# Patient Record
Sex: Male | Born: 1955 | Race: Black or African American | Smoking: Never smoker
Health system: Southern US, Community
[De-identification: ages and names within clinical notes are randomized; demographics above are authoritative.]

## PROBLEM LIST (undated history)

## (undated) DIAGNOSIS — H269 Unspecified cataract: Secondary | ICD-10-CM

## (undated) DIAGNOSIS — M1990 Unspecified osteoarthritis, unspecified site: Secondary | ICD-10-CM

## (undated) DIAGNOSIS — A159 Respiratory tuberculosis unspecified: Secondary | ICD-10-CM

## (undated) DIAGNOSIS — T7840XA Allergy, unspecified, initial encounter: Secondary | ICD-10-CM

## (undated) DIAGNOSIS — K219 Gastro-esophageal reflux disease without esophagitis: Secondary | ICD-10-CM

## (undated) DIAGNOSIS — I10 Essential (primary) hypertension: Secondary | ICD-10-CM

## (undated) HISTORY — PX: ACHILLES TENDON SURGERY: SHX542

## (undated) HISTORY — DX: Essential (primary) hypertension: I10

## (undated) HISTORY — DX: Respiratory tuberculosis unspecified: A15.9

## (undated) HISTORY — PX: SHOULDER ARTHROSCOPY: SHX128

## (undated) HISTORY — DX: Unspecified osteoarthritis, unspecified site: M19.90

## (undated) HISTORY — DX: Allergy, unspecified, initial encounter: T78.40XA

## (undated) HISTORY — DX: Unspecified cataract: H26.9

## (undated) HISTORY — DX: Gastro-esophageal reflux disease without esophagitis: K21.9

## (undated) HISTORY — PX: KNEE ARTHROSCOPY: SUR90

---

## 2014-02-13 DIAGNOSIS — K219 Gastro-esophageal reflux disease without esophagitis: Secondary | ICD-10-CM | POA: Diagnosis not present

## 2014-02-13 DIAGNOSIS — M2550 Pain in unspecified joint: Secondary | ICD-10-CM | POA: Diagnosis not present

## 2014-02-13 DIAGNOSIS — M1990 Unspecified osteoarthritis, unspecified site: Secondary | ICD-10-CM | POA: Diagnosis not present

## 2014-02-13 DIAGNOSIS — R5383 Other fatigue: Secondary | ICD-10-CM | POA: Diagnosis not present

## 2015-01-06 ENCOUNTER — Ambulatory Visit
Admission: RE | Admit: 2015-01-06 | Discharge: 2015-01-06 | Disposition: A | Discharge status: Home / Self Care | Payer: PPO | Source: Ambulatory Visit | Attending: Family Medicine | Admitting: Family Medicine

## 2015-01-06 ENCOUNTER — Other Ambulatory Visit: Payer: Self-pay | Admitting: Family Medicine

## 2015-01-06 DIAGNOSIS — M1711 Unilateral primary osteoarthritis, right knee: Secondary | ICD-10-CM

## 2015-09-22 DIAGNOSIS — J309 Allergic rhinitis, unspecified: Secondary | ICD-10-CM | POA: Diagnosis not present

## 2015-09-22 DIAGNOSIS — E782 Mixed hyperlipidemia: Secondary | ICD-10-CM | POA: Diagnosis not present

## 2015-09-22 DIAGNOSIS — K219 Gastro-esophageal reflux disease without esophagitis: Secondary | ICD-10-CM | POA: Diagnosis not present

## 2015-09-22 DIAGNOSIS — Z0000 Encounter for general adult medical examination without abnormal findings: Secondary | ICD-10-CM | POA: Diagnosis not present

## 2015-09-22 DIAGNOSIS — M19011 Primary osteoarthritis, right shoulder: Secondary | ICD-10-CM | POA: Diagnosis not present

## 2015-09-22 DIAGNOSIS — Z23 Encounter for immunization: Secondary | ICD-10-CM | POA: Diagnosis not present

## 2015-10-26 DIAGNOSIS — H2513 Age-related nuclear cataract, bilateral: Secondary | ICD-10-CM | POA: Diagnosis not present

## 2015-10-26 DIAGNOSIS — H04123 Dry eye syndrome of bilateral lacrimal glands: Secondary | ICD-10-CM | POA: Diagnosis not present

## 2015-10-26 DIAGNOSIS — H35033 Hypertensive retinopathy, bilateral: Secondary | ICD-10-CM | POA: Diagnosis not present

## 2015-10-26 DIAGNOSIS — H25013 Cortical age-related cataract, bilateral: Secondary | ICD-10-CM | POA: Diagnosis not present

## 2015-12-07 DIAGNOSIS — Z136 Encounter for screening for cardiovascular disorders: Secondary | ICD-10-CM | POA: Diagnosis not present

## 2015-12-07 DIAGNOSIS — R5383 Other fatigue: Secondary | ICD-10-CM | POA: Diagnosis not present

## 2015-12-07 DIAGNOSIS — E782 Mixed hyperlipidemia: Secondary | ICD-10-CM | POA: Diagnosis not present

## 2015-12-09 DIAGNOSIS — M2550 Pain in unspecified joint: Secondary | ICD-10-CM | POA: Diagnosis not present

## 2015-12-09 DIAGNOSIS — R739 Hyperglycemia, unspecified: Secondary | ICD-10-CM | POA: Diagnosis not present

## 2015-12-09 DIAGNOSIS — E782 Mixed hyperlipidemia: Secondary | ICD-10-CM | POA: Diagnosis not present

## 2015-12-09 DIAGNOSIS — J309 Allergic rhinitis, unspecified: Secondary | ICD-10-CM | POA: Diagnosis not present

## 2015-12-09 DIAGNOSIS — K219 Gastro-esophageal reflux disease without esophagitis: Secondary | ICD-10-CM | POA: Diagnosis not present

## 2016-03-21 DIAGNOSIS — E782 Mixed hyperlipidemia: Secondary | ICD-10-CM | POA: Diagnosis not present

## 2016-03-21 DIAGNOSIS — E669 Obesity, unspecified: Secondary | ICD-10-CM | POA: Diagnosis not present

## 2016-03-21 DIAGNOSIS — Z6831 Body mass index (BMI) 31.0-31.9, adult: Secondary | ICD-10-CM | POA: Diagnosis not present

## 2016-03-21 DIAGNOSIS — R5383 Other fatigue: Secondary | ICD-10-CM | POA: Diagnosis not present

## 2016-08-15 DIAGNOSIS — R5383 Other fatigue: Secondary | ICD-10-CM | POA: Diagnosis not present

## 2016-08-15 DIAGNOSIS — E559 Vitamin D deficiency, unspecified: Secondary | ICD-10-CM | POA: Diagnosis not present

## 2016-08-15 DIAGNOSIS — Z136 Encounter for screening for cardiovascular disorders: Secondary | ICD-10-CM | POA: Diagnosis not present

## 2016-08-17 DIAGNOSIS — E559 Vitamin D deficiency, unspecified: Secondary | ICD-10-CM | POA: Diagnosis not present

## 2016-08-17 DIAGNOSIS — E782 Mixed hyperlipidemia: Secondary | ICD-10-CM | POA: Diagnosis not present

## 2016-08-17 DIAGNOSIS — K219 Gastro-esophageal reflux disease without esophagitis: Secondary | ICD-10-CM | POA: Diagnosis not present

## 2016-08-17 DIAGNOSIS — Z6832 Body mass index (BMI) 32.0-32.9, adult: Secondary | ICD-10-CM | POA: Diagnosis not present

## 2016-09-05 DIAGNOSIS — G8192 Hemiplegia, unspecified affecting left dominant side: Secondary | ICD-10-CM | POA: Diagnosis not present

## 2016-09-05 DIAGNOSIS — Z6831 Body mass index (BMI) 31.0-31.9, adult: Secondary | ICD-10-CM | POA: Diagnosis not present

## 2016-09-05 DIAGNOSIS — I10 Essential (primary) hypertension: Secondary | ICD-10-CM | POA: Diagnosis not present

## 2016-09-05 DIAGNOSIS — R030 Elevated blood-pressure reading, without diagnosis of hypertension: Secondary | ICD-10-CM | POA: Diagnosis not present

## 2016-09-06 ENCOUNTER — Other Ambulatory Visit: Payer: Self-pay | Admitting: Family Medicine

## 2016-09-06 DIAGNOSIS — R200 Anesthesia of skin: Secondary | ICD-10-CM

## 2016-09-07 ENCOUNTER — Ambulatory Visit
Admission: RE | Admit: 2016-09-07 | Discharge: 2016-09-07 | Disposition: A | Discharge status: Home / Self Care | Payer: PPO | Source: Ambulatory Visit | Attending: Family Medicine | Admitting: Family Medicine

## 2016-09-07 DIAGNOSIS — R42 Dizziness and giddiness: Secondary | ICD-10-CM | POA: Diagnosis not present

## 2016-09-07 DIAGNOSIS — R200 Anesthesia of skin: Secondary | ICD-10-CM | POA: Diagnosis not present

## 2016-09-19 DIAGNOSIS — S43431S Superior glenoid labrum lesion of right shoulder, sequela: Secondary | ICD-10-CM | POA: Diagnosis not present

## 2016-09-19 DIAGNOSIS — K219 Gastro-esophageal reflux disease without esophagitis: Secondary | ICD-10-CM | POA: Diagnosis not present

## 2016-09-19 DIAGNOSIS — E782 Mixed hyperlipidemia: Secondary | ICD-10-CM | POA: Diagnosis not present

## 2016-09-19 DIAGNOSIS — Z0000 Encounter for general adult medical examination without abnormal findings: Secondary | ICD-10-CM | POA: Diagnosis not present

## 2016-09-19 DIAGNOSIS — Z1211 Encounter for screening for malignant neoplasm of colon: Secondary | ICD-10-CM | POA: Diagnosis not present

## 2016-09-19 DIAGNOSIS — I10 Essential (primary) hypertension: Secondary | ICD-10-CM | POA: Diagnosis not present

## 2016-09-19 DIAGNOSIS — M1990 Unspecified osteoarthritis, unspecified site: Secondary | ICD-10-CM | POA: Diagnosis not present

## 2016-09-23 ENCOUNTER — Encounter: Payer: Self-pay | Admitting: Internal Medicine

## 2016-11-15 ENCOUNTER — Ambulatory Visit (AMBULATORY_SURGERY_CENTER): Payer: Self-pay | Admitting: *Deleted

## 2016-11-15 VITALS — Ht 69.0 in | Wt 207.2 lb

## 2016-11-15 DIAGNOSIS — Z1211 Encounter for screening for malignant neoplasm of colon: Secondary | ICD-10-CM

## 2016-11-15 MED ORDER — NA SULFATE-K SULFATE-MG SULF 17.5-3.13-1.6 GM/177ML PO SOLN: ORAL | 0 refills | Status: DC

## 2016-11-15 NOTE — Progress Notes (Signed)
No allergies to eggs or soy. No problems with anesthesia.  Pt given Emmi instructions for colonoscopy  No oxygen use  No diet drug use  

## 2016-11-17 ENCOUNTER — Encounter: Payer: Self-pay | Admitting: Internal Medicine

## 2016-11-29 ENCOUNTER — Encounter: Payer: PPO | Admitting: Internal Medicine

## 2017-03-15 DIAGNOSIS — E559 Vitamin D deficiency, unspecified: Secondary | ICD-10-CM | POA: Diagnosis not present

## 2017-03-15 DIAGNOSIS — I10 Essential (primary) hypertension: Secondary | ICD-10-CM | POA: Diagnosis not present

## 2017-03-15 DIAGNOSIS — Z6830 Body mass index (BMI) 30.0-30.9, adult: Secondary | ICD-10-CM | POA: Diagnosis not present

## 2017-03-15 DIAGNOSIS — E782 Mixed hyperlipidemia: Secondary | ICD-10-CM | POA: Diagnosis not present

## 2017-04-26 ENCOUNTER — Encounter: Payer: Self-pay | Admitting: Family Medicine

## 2017-05-30 DIAGNOSIS — Z6831 Body mass index (BMI) 31.0-31.9, adult: Secondary | ICD-10-CM | POA: Diagnosis not present

## 2017-05-30 DIAGNOSIS — J101 Influenza due to other identified influenza virus with other respiratory manifestations: Secondary | ICD-10-CM | POA: Diagnosis not present

## 2017-05-30 DIAGNOSIS — J069 Acute upper respiratory infection, unspecified: Secondary | ICD-10-CM | POA: Diagnosis not present

## 2017-06-19 DIAGNOSIS — I10 Essential (primary) hypertension: Secondary | ICD-10-CM | POA: Diagnosis not present

## 2017-06-21 DIAGNOSIS — J309 Allergic rhinitis, unspecified: Secondary | ICD-10-CM | POA: Diagnosis not present

## 2017-06-21 DIAGNOSIS — R05 Cough: Secondary | ICD-10-CM | POA: Diagnosis not present

## 2017-06-21 DIAGNOSIS — I10 Essential (primary) hypertension: Secondary | ICD-10-CM | POA: Diagnosis not present

## 2017-06-21 DIAGNOSIS — Z6831 Body mass index (BMI) 31.0-31.9, adult: Secondary | ICD-10-CM | POA: Diagnosis not present

## 2017-07-19 DIAGNOSIS — K219 Gastro-esophageal reflux disease without esophagitis: Secondary | ICD-10-CM | POA: Diagnosis not present

## 2017-07-19 DIAGNOSIS — D179 Benign lipomatous neoplasm, unspecified: Secondary | ICD-10-CM | POA: Diagnosis not present

## 2017-07-19 DIAGNOSIS — R05 Cough: Secondary | ICD-10-CM | POA: Diagnosis not present

## 2017-07-19 DIAGNOSIS — J309 Allergic rhinitis, unspecified: Secondary | ICD-10-CM | POA: Diagnosis not present

## 2017-07-19 DIAGNOSIS — Z6831 Body mass index (BMI) 31.0-31.9, adult: Secondary | ICD-10-CM | POA: Diagnosis not present

## 2017-09-21 DIAGNOSIS — Z125 Encounter for screening for malignant neoplasm of prostate: Secondary | ICD-10-CM | POA: Diagnosis not present

## 2017-09-21 DIAGNOSIS — Z1211 Encounter for screening for malignant neoplasm of colon: Secondary | ICD-10-CM | POA: Diagnosis not present

## 2017-09-21 DIAGNOSIS — Z0000 Encounter for general adult medical examination without abnormal findings: Secondary | ICD-10-CM | POA: Diagnosis not present

## 2017-09-21 DIAGNOSIS — Z6831 Body mass index (BMI) 31.0-31.9, adult: Secondary | ICD-10-CM | POA: Diagnosis not present

## 2017-10-11 ENCOUNTER — Ambulatory Visit: Payer: PPO | Admitting: Psychology

## 2018-03-22 DIAGNOSIS — Z6831 Body mass index (BMI) 31.0-31.9, adult: Secondary | ICD-10-CM | POA: Diagnosis not present

## 2018-03-22 DIAGNOSIS — K219 Gastro-esophageal reflux disease without esophagitis: Secondary | ICD-10-CM | POA: Diagnosis not present

## 2018-03-22 DIAGNOSIS — F320 Major depressive disorder, single episode, mild: Secondary | ICD-10-CM | POA: Diagnosis not present

## 2018-04-04 DIAGNOSIS — H2513 Age-related nuclear cataract, bilateral: Secondary | ICD-10-CM | POA: Diagnosis not present

## 2018-04-04 DIAGNOSIS — H25013 Cortical age-related cataract, bilateral: Secondary | ICD-10-CM | POA: Diagnosis not present

## 2018-04-04 DIAGNOSIS — H35033 Hypertensive retinopathy, bilateral: Secondary | ICD-10-CM | POA: Diagnosis not present

## 2018-04-04 DIAGNOSIS — H02823 Cysts of right eye, unspecified eyelid: Secondary | ICD-10-CM | POA: Diagnosis not present

## 2018-09-24 DIAGNOSIS — M19011 Primary osteoarthritis, right shoulder: Secondary | ICD-10-CM | POA: Diagnosis not present

## 2018-09-24 DIAGNOSIS — J309 Allergic rhinitis, unspecified: Secondary | ICD-10-CM | POA: Diagnosis not present

## 2018-09-24 DIAGNOSIS — Z0000 Encounter for general adult medical examination without abnormal findings: Secondary | ICD-10-CM | POA: Diagnosis not present

## 2018-09-24 DIAGNOSIS — M1711 Unilateral primary osteoarthritis, right knee: Secondary | ICD-10-CM | POA: Diagnosis not present

## 2018-09-24 DIAGNOSIS — R51 Headache: Secondary | ICD-10-CM | POA: Diagnosis not present

## 2018-09-27 ENCOUNTER — Other Ambulatory Visit: Payer: Self-pay

## 2018-10-23 DIAGNOSIS — Z23 Encounter for immunization: Secondary | ICD-10-CM | POA: Diagnosis not present

## 2018-10-24 DIAGNOSIS — R030 Elevated blood-pressure reading, without diagnosis of hypertension: Secondary | ICD-10-CM | POA: Diagnosis not present

## 2018-10-24 DIAGNOSIS — R002 Palpitations: Secondary | ICD-10-CM | POA: Diagnosis not present

## 2018-10-24 DIAGNOSIS — E782 Mixed hyperlipidemia: Secondary | ICD-10-CM | POA: Diagnosis not present

## 2018-10-24 DIAGNOSIS — G4700 Insomnia, unspecified: Secondary | ICD-10-CM | POA: Diagnosis not present

## 2018-10-24 DIAGNOSIS — E559 Vitamin D deficiency, unspecified: Secondary | ICD-10-CM | POA: Diagnosis not present

## 2018-10-24 DIAGNOSIS — Z733 Stress, not elsewhere classified: Secondary | ICD-10-CM | POA: Diagnosis not present

## 2018-10-24 DIAGNOSIS — E079 Disorder of thyroid, unspecified: Secondary | ICD-10-CM | POA: Diagnosis not present

## 2018-10-24 DIAGNOSIS — I10 Essential (primary) hypertension: Secondary | ICD-10-CM | POA: Diagnosis not present

## 2018-10-30 DIAGNOSIS — E559 Vitamin D deficiency, unspecified: Secondary | ICD-10-CM | POA: Diagnosis not present

## 2018-10-30 DIAGNOSIS — G4700 Insomnia, unspecified: Secondary | ICD-10-CM | POA: Diagnosis not present

## 2018-10-30 DIAGNOSIS — R002 Palpitations: Secondary | ICD-10-CM | POA: Diagnosis not present

## 2018-10-30 DIAGNOSIS — Z733 Stress, not elsewhere classified: Secondary | ICD-10-CM | POA: Diagnosis not present

## 2018-11-27 DIAGNOSIS — Z23 Encounter for immunization: Secondary | ICD-10-CM | POA: Diagnosis not present

## 2018-12-18 DIAGNOSIS — G4700 Insomnia, unspecified: Secondary | ICD-10-CM | POA: Diagnosis not present

## 2018-12-18 DIAGNOSIS — E782 Mixed hyperlipidemia: Secondary | ICD-10-CM | POA: Diagnosis not present

## 2018-12-18 DIAGNOSIS — Z6829 Body mass index (BMI) 29.0-29.9, adult: Secondary | ICD-10-CM | POA: Diagnosis not present

## 2018-12-18 DIAGNOSIS — E559 Vitamin D deficiency, unspecified: Secondary | ICD-10-CM | POA: Diagnosis not present

## 2019-01-21 ENCOUNTER — Other Ambulatory Visit: Payer: Self-pay

## 2019-04-23 DIAGNOSIS — Z23 Encounter for immunization: Secondary | ICD-10-CM | POA: Diagnosis not present

## 2019-05-15 DIAGNOSIS — E559 Vitamin D deficiency, unspecified: Secondary | ICD-10-CM | POA: Diagnosis not present

## 2019-05-15 DIAGNOSIS — I10 Essential (primary) hypertension: Secondary | ICD-10-CM | POA: Diagnosis not present

## 2019-05-15 DIAGNOSIS — E782 Mixed hyperlipidemia: Secondary | ICD-10-CM | POA: Diagnosis not present

## 2019-05-21 DIAGNOSIS — E782 Mixed hyperlipidemia: Secondary | ICD-10-CM | POA: Diagnosis not present

## 2019-05-21 DIAGNOSIS — K219 Gastro-esophageal reflux disease without esophagitis: Secondary | ICD-10-CM | POA: Diagnosis not present

## 2019-05-21 DIAGNOSIS — I10 Essential (primary) hypertension: Secondary | ICD-10-CM | POA: Diagnosis not present

## 2019-05-21 DIAGNOSIS — E559 Vitamin D deficiency, unspecified: Secondary | ICD-10-CM | POA: Diagnosis not present

## 2019-09-25 DIAGNOSIS — Z0000 Encounter for general adult medical examination without abnormal findings: Secondary | ICD-10-CM | POA: Diagnosis not present

## 2019-09-25 DIAGNOSIS — Z1339 Encounter for screening examination for other mental health and behavioral disorders: Secondary | ICD-10-CM | POA: Diagnosis not present

## 2019-09-25 DIAGNOSIS — Z1331 Encounter for screening for depression: Secondary | ICD-10-CM | POA: Diagnosis not present

## 2019-09-25 DIAGNOSIS — S4380XA Sprain of other specified parts of unspecified shoulder girdle, initial encounter: Secondary | ICD-10-CM | POA: Diagnosis not present

## 2019-09-25 DIAGNOSIS — M170 Bilateral primary osteoarthritis of knee: Secondary | ICD-10-CM | POA: Diagnosis not present

## 2019-09-25 DIAGNOSIS — M19019 Primary osteoarthritis, unspecified shoulder: Secondary | ICD-10-CM | POA: Diagnosis not present

## 2019-11-12 DIAGNOSIS — E559 Vitamin D deficiency, unspecified: Secondary | ICD-10-CM | POA: Diagnosis not present

## 2019-11-12 DIAGNOSIS — E782 Mixed hyperlipidemia: Secondary | ICD-10-CM | POA: Diagnosis not present

## 2019-11-21 DIAGNOSIS — E782 Mixed hyperlipidemia: Secondary | ICD-10-CM | POA: Diagnosis not present

## 2019-11-21 DIAGNOSIS — G4700 Insomnia, unspecified: Secondary | ICD-10-CM | POA: Diagnosis not present

## 2019-11-21 DIAGNOSIS — M1990 Unspecified osteoarthritis, unspecified site: Secondary | ICD-10-CM | POA: Diagnosis not present

## 2019-11-21 DIAGNOSIS — J309 Allergic rhinitis, unspecified: Secondary | ICD-10-CM | POA: Diagnosis not present

## 2019-11-21 DIAGNOSIS — E559 Vitamin D deficiency, unspecified: Secondary | ICD-10-CM | POA: Diagnosis not present

## 2020-05-07 DIAGNOSIS — L723 Sebaceous cyst: Secondary | ICD-10-CM | POA: Diagnosis not present

## 2020-05-07 DIAGNOSIS — I10 Essential (primary) hypertension: Secondary | ICD-10-CM | POA: Diagnosis not present

## 2020-05-07 DIAGNOSIS — L0291 Cutaneous abscess, unspecified: Secondary | ICD-10-CM | POA: Diagnosis not present

## 2020-05-07 DIAGNOSIS — L089 Local infection of the skin and subcutaneous tissue, unspecified: Secondary | ICD-10-CM | POA: Diagnosis not present

## 2020-05-12 DIAGNOSIS — R030 Elevated blood-pressure reading, without diagnosis of hypertension: Secondary | ICD-10-CM | POA: Diagnosis not present

## 2020-05-29 DIAGNOSIS — I10 Essential (primary) hypertension: Secondary | ICD-10-CM | POA: Diagnosis not present

## 2020-05-29 DIAGNOSIS — E782 Mixed hyperlipidemia: Secondary | ICD-10-CM | POA: Diagnosis not present

## 2020-06-01 DIAGNOSIS — H25013 Cortical age-related cataract, bilateral: Secondary | ICD-10-CM | POA: Diagnosis not present

## 2020-06-01 DIAGNOSIS — H35363 Drusen (degenerative) of macula, bilateral: Secondary | ICD-10-CM | POA: Diagnosis not present

## 2020-06-01 DIAGNOSIS — H2513 Age-related nuclear cataract, bilateral: Secondary | ICD-10-CM | POA: Diagnosis not present

## 2020-06-01 DIAGNOSIS — H524 Presbyopia: Secondary | ICD-10-CM | POA: Diagnosis not present

## 2020-06-01 DIAGNOSIS — H43812 Vitreous degeneration, left eye: Secondary | ICD-10-CM | POA: Diagnosis not present

## 2020-06-02 DIAGNOSIS — E119 Type 2 diabetes mellitus without complications: Secondary | ICD-10-CM | POA: Diagnosis not present

## 2020-06-02 DIAGNOSIS — I10 Essential (primary) hypertension: Secondary | ICD-10-CM | POA: Diagnosis not present

## 2020-06-02 DIAGNOSIS — E782 Mixed hyperlipidemia: Secondary | ICD-10-CM | POA: Diagnosis not present

## 2020-06-11 DIAGNOSIS — I10 Essential (primary) hypertension: Secondary | ICD-10-CM | POA: Diagnosis not present

## 2020-06-11 DIAGNOSIS — E782 Mixed hyperlipidemia: Secondary | ICD-10-CM | POA: Diagnosis not present

## 2020-06-11 DIAGNOSIS — E669 Obesity, unspecified: Secondary | ICD-10-CM | POA: Diagnosis not present

## 2020-06-11 DIAGNOSIS — E119 Type 2 diabetes mellitus without complications: Secondary | ICD-10-CM | POA: Diagnosis not present

## 2020-06-29 DIAGNOSIS — E669 Obesity, unspecified: Secondary | ICD-10-CM | POA: Diagnosis not present

## 2020-06-29 DIAGNOSIS — I10 Essential (primary) hypertension: Secondary | ICD-10-CM | POA: Diagnosis not present

## 2020-06-29 DIAGNOSIS — E782 Mixed hyperlipidemia: Secondary | ICD-10-CM | POA: Diagnosis not present

## 2020-07-13 DIAGNOSIS — I10 Essential (primary) hypertension: Secondary | ICD-10-CM | POA: Diagnosis not present

## 2020-07-13 DIAGNOSIS — E119 Type 2 diabetes mellitus without complications: Secondary | ICD-10-CM | POA: Diagnosis not present

## 2020-07-13 DIAGNOSIS — E669 Obesity, unspecified: Secondary | ICD-10-CM | POA: Diagnosis not present

## 2020-07-13 DIAGNOSIS — E782 Mixed hyperlipidemia: Secondary | ICD-10-CM | POA: Diagnosis not present

## 2020-07-16 DIAGNOSIS — Z23 Encounter for immunization: Secondary | ICD-10-CM | POA: Diagnosis not present

## 2020-07-20 DIAGNOSIS — E782 Mixed hyperlipidemia: Secondary | ICD-10-CM | POA: Diagnosis not present

## 2020-07-20 DIAGNOSIS — I10 Essential (primary) hypertension: Secondary | ICD-10-CM | POA: Diagnosis not present

## 2020-07-20 DIAGNOSIS — R202 Paresthesia of skin: Secondary | ICD-10-CM | POA: Diagnosis not present

## 2020-07-20 DIAGNOSIS — E663 Overweight: Secondary | ICD-10-CM | POA: Diagnosis not present

## 2020-08-19 DIAGNOSIS — E119 Type 2 diabetes mellitus without complications: Secondary | ICD-10-CM | POA: Diagnosis not present

## 2020-08-19 DIAGNOSIS — I10 Essential (primary) hypertension: Secondary | ICD-10-CM | POA: Diagnosis not present

## 2020-08-19 DIAGNOSIS — F4321 Adjustment disorder with depressed mood: Secondary | ICD-10-CM | POA: Diagnosis not present

## 2020-10-01 DIAGNOSIS — Z1331 Encounter for screening for depression: Secondary | ICD-10-CM | POA: Diagnosis not present

## 2020-10-01 DIAGNOSIS — Z0000 Encounter for general adult medical examination without abnormal findings: Secondary | ICD-10-CM | POA: Diagnosis not present

## 2020-10-01 DIAGNOSIS — Z1339 Encounter for screening examination for other mental health and behavioral disorders: Secondary | ICD-10-CM | POA: Diagnosis not present

## 2020-10-01 DIAGNOSIS — G4700 Insomnia, unspecified: Secondary | ICD-10-CM | POA: Diagnosis not present

## 2020-11-19 DIAGNOSIS — I10 Essential (primary) hypertension: Secondary | ICD-10-CM | POA: Diagnosis not present

## 2020-11-19 DIAGNOSIS — E559 Vitamin D deficiency, unspecified: Secondary | ICD-10-CM | POA: Diagnosis not present

## 2020-11-19 DIAGNOSIS — Z125 Encounter for screening for malignant neoplasm of prostate: Secondary | ICD-10-CM | POA: Diagnosis not present

## 2020-11-19 DIAGNOSIS — Z23 Encounter for immunization: Secondary | ICD-10-CM | POA: Diagnosis not present

## 2020-11-19 DIAGNOSIS — E782 Mixed hyperlipidemia: Secondary | ICD-10-CM | POA: Diagnosis not present

## 2020-11-19 DIAGNOSIS — E669 Obesity, unspecified: Secondary | ICD-10-CM | POA: Diagnosis not present

## 2020-11-23 DIAGNOSIS — I10 Essential (primary) hypertension: Secondary | ICD-10-CM | POA: Diagnosis not present

## 2020-11-23 DIAGNOSIS — R5383 Other fatigue: Secondary | ICD-10-CM | POA: Diagnosis not present

## 2020-11-23 DIAGNOSIS — E782 Mixed hyperlipidemia: Secondary | ICD-10-CM | POA: Diagnosis not present

## 2020-11-23 DIAGNOSIS — F4321 Adjustment disorder with depressed mood: Secondary | ICD-10-CM | POA: Diagnosis not present

## 2020-11-23 DIAGNOSIS — E559 Vitamin D deficiency, unspecified: Secondary | ICD-10-CM | POA: Diagnosis not present

## 2020-11-23 DIAGNOSIS — R7989 Other specified abnormal findings of blood chemistry: Secondary | ICD-10-CM | POA: Diagnosis not present

## 2020-11-27 ENCOUNTER — Other Ambulatory Visit: Payer: Self-pay

## 2020-11-27 ENCOUNTER — Emergency Department (HOSPITAL_COMMUNITY)
Admission: EM | Admit: 2020-11-27 | Discharge: 2020-11-27 | Disposition: A | Discharge status: Home / Self Care | Payer: PPO | Attending: Emergency Medicine | Admitting: Emergency Medicine

## 2020-11-27 ENCOUNTER — Emergency Department (HOSPITAL_COMMUNITY): Payer: PPO

## 2020-11-27 DIAGNOSIS — R079 Chest pain, unspecified: Secondary | ICD-10-CM | POA: Diagnosis not present

## 2020-11-27 DIAGNOSIS — J3489 Other specified disorders of nose and nasal sinuses: Secondary | ICD-10-CM | POA: Diagnosis not present

## 2020-11-27 DIAGNOSIS — R002 Palpitations: Secondary | ICD-10-CM | POA: Diagnosis not present

## 2020-11-27 DIAGNOSIS — I10 Essential (primary) hypertension: Secondary | ICD-10-CM | POA: Insufficient documentation

## 2020-11-27 DIAGNOSIS — Z79899 Other long term (current) drug therapy: Secondary | ICD-10-CM | POA: Diagnosis not present

## 2020-11-27 DIAGNOSIS — R202 Paresthesia of skin: Secondary | ICD-10-CM | POA: Insufficient documentation

## 2020-11-27 LAB — CBC WITH DIFFERENTIAL/PLATELET
Abs Immature Granulocytes: 0.01 10*3/uL (ref 0.00–0.07)
Basophils Absolute: 0.1 10*3/uL (ref 0.0–0.1)
Basophils Relative: 1 %
Eosinophils Absolute: 0.4 10*3/uL (ref 0.0–0.5)
Eosinophils Relative: 6 %
HCT: 46.7 % (ref 39.0–52.0)
Hemoglobin: 15 g/dL (ref 13.0–17.0)
Immature Granulocytes: 0 %
Lymphocytes Relative: 39 %
Lymphs Abs: 2.4 10*3/uL (ref 0.7–4.0)
MCH: 28.6 pg (ref 26.0–34.0)
MCHC: 32.1 g/dL (ref 30.0–36.0)
MCV: 89.1 fL (ref 80.0–100.0)
Monocytes Absolute: 0.6 10*3/uL (ref 0.1–1.0)
Monocytes Relative: 10 %
Neutro Abs: 2.8 10*3/uL (ref 1.7–7.7)
Neutrophils Relative %: 44 %
Platelets: 377 10*3/uL (ref 150–400)
RBC: 5.24 MIL/uL (ref 4.22–5.81)
RDW: 13.7 % (ref 11.5–15.5)
WBC: 6.2 10*3/uL (ref 4.0–10.5)
nRBC: 0 % (ref 0.0–0.2)

## 2020-11-27 LAB — COMPREHENSIVE METABOLIC PANEL
ALT: 19 U/L (ref 0–44)
AST: 25 U/L (ref 15–41)
Albumin: 3.8 g/dL (ref 3.5–5.0)
Alkaline Phosphatase: 70 U/L (ref 38–126)
Anion gap: 6 (ref 5–15)
BUN: 17 mg/dL (ref 8–23)
CO2: 27 mmol/L (ref 22–32)
Calcium: 8.9 mg/dL (ref 8.9–10.3)
Chloride: 105 mmol/L (ref 98–111)
Creatinine, Ser: 1.27 mg/dL — ABNORMAL HIGH (ref 0.61–1.24)
GFR, Estimated: >60 mL/min (ref >60)
Glucose, Bld: 89 mg/dL (ref 70–99)
Potassium: 4.6 mmol/L (ref 3.5–5.1)
Sodium: 138 mmol/L (ref 135–145)
Total Bilirubin: 1.2 mg/dL (ref 0.3–1.2)
Total Protein: 6.8 g/dL (ref 6.5–8.1)

## 2020-11-27 LAB — TROPONIN I (HIGH SENSITIVITY)
Troponin I (High Sensitivity): 4 ng/L (ref <18)
Troponin I (High Sensitivity): 4 ng/L (ref <18)

## 2020-11-27 LAB — MAGNESIUM: Magnesium: 2.1 mg/dL (ref 1.7–2.4)

## 2020-11-27 LAB — TSH: TSH: 1.336 u[IU]/mL (ref 0.350–4.500)

## 2020-11-27 MED ORDER — SODIUM CHLORIDE 0.9 % IV BOLUS: 500.0000 mL | Freq: Once | INTRAVENOUS | Status: DC

## 2020-11-27 NOTE — ED Triage Notes (Addendum)
Pt here POV d/t irregular heart beat X2 days. Pt states this am it became very consistent. Pt states he feels tingles throughout his body when this happen and begins to burp. Decreased sensation on left arm and leg with weakness. Denies CP LKW 2100 11/26/20

## 2020-11-27 NOTE — ED Provider Notes (Signed)
Emergency Medicine Provider Triage Evaluation Note  Albert Howell , a 65 y.o. male  was evaluated in triage.  Pt complains of intermittent irregular heart beat for the past 2-3 days. Pt also complains of a decreased sensation to his LUE and LLE x "Awhile" intermittently however consistent since around 9 PM last night. No weakness. Denies SOB.   Review of Systems  Positive: + chest pain, heart palpitations, decreased sensation to LUE/LLE Negative: - weakness, facial droop, speech changes  Physical Exam  BP (!) 147/82 (BP Location: Left Arm)   Pulse 73   Temp 97.8 F (36.6 C) (Oral)   Resp 16   Ht 5\' 8"  (1.727 m)   Wt 88.5 kg   SpO2 100%   BMI 29.65 kg/m  Gen:   Awake, no distress   Resp:  Normal effort  MSK:   Moves extremities without difficulty  Other:  RRR. No facial droop. CN intact. Equal strength bilaterally. Decreased subjective sensation to LUE and LLE  Medical Decision Making  Medically screening exam initiated at 11:46 AM.  Appropriate orders placed.  FREAD KOTTKE was informed that the remainder of the evaluation will be completed by another provider, this initial triage assessment does not replace that evaluation, and the importance of remaining in the ED until their evaluation is complete.     Eustaquio Maize, PA-C 11/27/20 1149    Charlesetta Shanks, MD 11/27/20 1409

## 2020-11-27 NOTE — Discharge Instructions (Signed)
Sting and evaluation today was reassuring, however you need to follow-up with your primary care doctor.  They may consider long-term cardiac monitoring otherwise known as a Holter monitor to evaluate your heart rate and rhythm over a longer period of time.  Please continue to hydrate orally by drinking lots of water.  Return to the ED with new or worsening symptoms including chest pain and shortness of breath.  If you experience heart palpitations that do not resolve please return to the ED.

## 2020-11-27 NOTE — ED Notes (Signed)
E-signature pad unavailable at time of pt discharge. This RN discussed discharge materials with pt and answered all pt questions. Pt stated understanding of discharge material. ? ?

## 2020-11-27 NOTE — ED Provider Notes (Signed)
[facility] EMERGENCY DEPARTMENT Provider Note   CSN: [id number] Arrival date & time: 11/27/20  1115     History Chief Complaint  Patient presents with   Irregular Heart Beat    Albert Howell is a 65 y.o. male.  HPI This is a 65 year old male with history of hypertension who presents with palpitations.  Patient reports intermittent palpitations for the past week associated with a numbness and tingling in the upper extremities.  This happened several times today and typically last for less than 30 seconds and then resolves without intervention.  Occurs both at rest and with activity.  Not associated with chest pain, shortness of breath, diaphoresis, nausea, vomiting.  No recent sick symptoms including cough, congestion, or fever.  Patient has no history of blood clots and does not smoke.  No recent history of vomiting or diarrhea and has been tolerating p.o. intake appropriately.  Albert Howell denies any associated focal weakness, facial droop, or slurred speech.    Past Medical History:  Diagnosis Date   Arthritis    GERD (gastroesophageal reflux disease)    Hypertension     There are no problems to display for this patient.   Past Surgical History:  Procedure Laterality Date   ACHILLES TENDON SURGERY Left    1998   KNEE ARTHROSCOPY Right    2000   SHOULDER ARTHROSCOPY Right    2013       Family History  Problem Relation Age of Onset   Colon cancer Neg Hx     Social History   Tobacco Use   Smoking status: Never   Smokeless tobacco: Never  Substance Use Topics   Alcohol use: Yes    Comment: rare- 2 drink every 2 months   Drug use: No    Home Medications Prior to Admission medications   Medication Sig Start Date End Date Taking? Authorizing Provider  Cholecalciferol (VITAMIN D3) 5000 units CAPS Take 5,000 Units by mouth daily.   Yes [provider]  ibuprofen (ADVIL) 200 MG tablet Take 200-400 mg by mouth daily as needed for mild pain.    Yes [provider]  multivitamin (ONE-A-DAY MEN'S) TABS tablet Take 1 tablet by mouth daily.   Yes [provider]  NON FORMULARY Take 1 Scoop by mouth See admin instructions. Soylent meal replacement- Mix 1 scoopful of powder into 20 ounces of oak milk and drink one time a day   Yes [provider]  omeprazole (PRILOSEC) 40 MG capsule Take 40 mg by mouth daily as needed (for heartburn).   Yes [provider]  valsartan (DIOVAN) 40 MG tablet Take 40 mg by mouth at bedtime.   Yes [provider]  diclofenac sodium (VOLTAREN) 1 % GEL Apply topically 4 (four) times daily. Patient not taking: No sig reported    [provider]  Na Sulfate-K Sulfate-Mg Sulf (SUPREP BOWEL PREP KIT) 17.5-3.13-1.6 GM/180ML SOLN suprep as directed.  No substitutions Patient not taking: Reported on 11/27/2020 11/15/16   Pyrtle, Lajuan Lines, MD    Allergies    Patient has no known allergies.  Review of Systems   Review of Systems  Constitutional:  Negative for chills and fever.  HENT:  Negative for ear pain and sore throat.   Eyes:  Negative for pain and visual disturbance.  Respiratory:  Negative for cough and shortness of breath.   Cardiovascular:  Positive for palpitations. Negative for chest pain.  Gastrointestinal:  Negative for abdominal pain and vomiting.  Genitourinary:  Negative for dysuria and hematuria.  Musculoskeletal:  Negative for arthralgias and back pain.  Skin:  Negative for color change and rash.  Neurological:  Positive for numbness. Negative for seizures and syncope.  All other systems reviewed and are negative.  Physical Exam Updated Vital Signs BP 132/88   Pulse 67   Temp 98 F (36.7 C) (Oral)   Resp 20   Ht 5' 8"  (1.727 m)   Wt 88.5 kg   SpO2 100%   BMI 29.65 kg/m   Physical Exam Vitals and nursing note reviewed.  Constitutional:      Appearance: Albert Howell is well-developed.  HENT:     Head: Normocephalic and atraumatic.  Eyes:      Conjunctiva/sclera: Conjunctivae normal.  Cardiovascular:     Rate and Rhythm: Normal rate and regular rhythm.     Heart sounds: No murmur heard. Pulmonary:     Effort: Pulmonary effort is normal. No respiratory distress.     Breath sounds: Normal breath sounds.  Abdominal:     Palpations: Abdomen is soft.     Tenderness: There is no abdominal tenderness.  Musculoskeletal:     Cervical back: Neck supple.  Skin:    General: Skin is warm and dry.     Capillary Refill: Capillary refill takes less than 2 seconds.  Neurological:     Mental Status: Albert Howell is alert.    ED Results / Procedures / Treatments   Labs (all labs ordered are listed, but only abnormal results are displayed) Labs Reviewed  COMPREHENSIVE METABOLIC PANEL - Abnormal; Notable for the following components:      Result Value   Creatinine, Ser 1.27 (*)    All other components within normal limits  CBC WITH DIFFERENTIAL/PLATELET  MAGNESIUM  TSH  TROPONIN I (HIGH SENSITIVITY)  TROPONIN I (HIGH SENSITIVITY)    EKG EKG Interpretation  Date/Time:  Friday November 27 2020 11:35:30 EDT Ventricular Rate:  78 PR Interval:  160 QRS Duration: 74 QT Interval:  368 QTC Calculation: 419 R Axis:   53 Text Interpretation: Sinus rhythm with marked sinus arrhythmia Otherwise normal ECG NSR, PACs, no previous Confirmed by Lavenia Atlas [id number]) on 11/27/2020 3:43:35 PM  Radiology DG Chest 2 View  Result Date: 11/27/2020 CLINICAL DATA:  Chest pain, irregular heartbeat EXAM: CHEST - 2 VIEW COMPARISON:  None. FINDINGS: The heart size and mediastinal contours are within normal limits. No focal airspace consolidation, pleural effusion, or pneumothorax. The visualized skeletal structures are unremarkable. IMPRESSION: No active cardiopulmonary disease. Electronically Signed   By: Davina Poke D.O.   On: 11/27/2020 12:29   CT Head Wo Contrast  Result Date: 11/27/2020 CLINICAL DATA:  decreased sensation to LUE and LLE since last  night around 9 PM. EXAM: CT HEAD WITHOUT CONTRAST TECHNIQUE: Contiguous axial images were obtained from the base of the skull through the vertex without intravenous contrast. COMPARISON:  CT Head 09/07/2016. FINDINGS: Brain: No evidence of acute large vascular territory infarction, hemorrhage, hydrocephalus, extra-axial collection or mass lesion/mass effect. Vascular: No hyperdense vessel identified. Mild calcific intracranial atherosclerosis. Skull: No acute fracture. Sinuses/Orbits: Moderate paranasal sinus mucosal thickening with probable retention cyst in the left maxillary sinus. Other: No mastoid effusions. IMPRESSION: 1. No evidence of acute intracranial abnormality. 2. Moderate paranasal sinus mucosal thickening. Electronically Signed   By: Margaretha Sheffield M.D.   On: 11/27/2020 12:50    Procedures Procedures   Medications Ordered in ED Medications - No data to display  ED Course  I  have reviewed the triage vital signs and the nursing notes.  Pertinent labs & imaging results that were available during my care of the patient were reviewed by me and considered in my medical decision making (see chart for details).    MDM Rules/Calculators/A&P                          Patient is hemodynamically stable and well-appearing.  Items on the differential include dehydration, electrolyte abnormality, metabolic derangement including uremia, ACS, arrhythmia, CVA, or thyroid dysfunction.  Low suspicion for PE given no chest pain, shortness of breath, tachycardia, hypoxia, or risk factors for PE.  CT head without evidence of acute intracranial abnormality, patient has no sensory deficits and nonfocal neurologic exam, low suspicion for ischemic or hemorrhagic stroke.  EKG with sinus arrhythmia versus PACs, no evidence of ischemia, heart block, or wide-complex rhythms.  Intervals and axis otherwise normal.  CBC is unremarkable with normal hemoglobin and normal white blood cell count.  Creatinine very  mildly elevated at 1.27.  Patient is hydrating orally.  Otherwise CMP is unremarkable as well as magnesium.  Troponin is negative x2 and patient has never had any chest pain.  Chest x-ray without other cardiopulmonary abnormality including pneumonia, pulmonary edema, or pneumothorax.  TSH is within normal limits.  Patient is tolerating p.o. and ambulating without difficulty, lightheadedness, or dizziness.  Albert Howell is stable for discharge home.  We will follow-up with his PCP and recommend referral to cardiology and Holter monitoring.  Albert Howell expressed understanding of this plan.  Given strict return precautions including chest pain or shortness of breath, or palpitations that do not resolve on their own.  Final Clinical Impression(s) / ED Diagnoses Final diagnoses:  Palpitations    Rx / DC Orders ED Discharge Orders     None        Coralee Pesa, MD 11/28/20 7124    Lorelle Gibbs, DO 12/01/20 1309

## 2020-12-01 DIAGNOSIS — Z23 Encounter for immunization: Secondary | ICD-10-CM | POA: Diagnosis not present

## 2020-12-01 DIAGNOSIS — K219 Gastro-esophageal reflux disease without esophagitis: Secondary | ICD-10-CM | POA: Diagnosis not present

## 2020-12-01 DIAGNOSIS — Z7185 Encounter for immunization safety counseling: Secondary | ICD-10-CM | POA: Diagnosis not present

## 2020-12-01 DIAGNOSIS — R002 Palpitations: Secondary | ICD-10-CM | POA: Diagnosis not present

## 2020-12-01 DIAGNOSIS — I10 Essential (primary) hypertension: Secondary | ICD-10-CM | POA: Diagnosis not present

## 2020-12-18 ENCOUNTER — Encounter: Payer: Self-pay | Admitting: Gastroenterology

## 2020-12-28 DIAGNOSIS — R002 Palpitations: Secondary | ICD-10-CM | POA: Diagnosis not present

## 2020-12-28 DIAGNOSIS — G4700 Insomnia, unspecified: Secondary | ICD-10-CM | POA: Diagnosis not present

## 2020-12-28 DIAGNOSIS — F411 Generalized anxiety disorder: Secondary | ICD-10-CM | POA: Diagnosis not present

## 2020-12-28 DIAGNOSIS — F331 Major depressive disorder, recurrent, moderate: Secondary | ICD-10-CM | POA: Diagnosis not present

## 2020-12-28 DIAGNOSIS — I10 Essential (primary) hypertension: Secondary | ICD-10-CM | POA: Diagnosis not present

## 2021-01-07 ENCOUNTER — Ambulatory Visit: Payer: PPO | Admitting: Cardiology

## 2021-01-07 ENCOUNTER — Other Ambulatory Visit: Payer: Self-pay

## 2021-01-07 ENCOUNTER — Encounter: Payer: Self-pay | Admitting: Cardiology

## 2021-01-07 VITALS — BP 127/78 | HR 67 | Resp 16 | Ht 68.0 in | Wt 203.2 lb

## 2021-01-07 DIAGNOSIS — R002 Palpitations: Secondary | ICD-10-CM

## 2021-01-07 DIAGNOSIS — I491 Atrial premature depolarization: Secondary | ICD-10-CM

## 2021-01-07 DIAGNOSIS — I10 Essential (primary) hypertension: Secondary | ICD-10-CM

## 2021-01-07 NOTE — Progress Notes (Signed)
Primary Physician/Referring:  Fanny Bien, MD  Patient ID: Albert Howell, male    DOB: [birth year], 65 y.o.   MRN: [id number]  Chief Complaint  Patient presents with   Palpitations   Hypertension   New Patient (Initial Visit)   HPI:    Albert Howell  is a 65 y.o. African-American male patient with history of hypertension, vitamin D deficiency, who is a non-smoker, nondiabetic referred to me for evaluation of palpitations.  Patient presented to the emergency room on 11/27/2020 with palpitations and intermittent tingling and numbness in different side of the body, CT scan of the head was normal and chest x-ray was normal, EKG revealed PACs and he was discharged home with recommendation to follow-up with PCP.  In view of his age and hypertension as risk factors he is now referred to me for evaluation.  Patient states that since that episode he has quit drinking caffeinated drinks and since then he has not had any further palpitations.  Essentially remains asymptomatic.  Past Medical History:  Diagnosis Date   Arthritis    GERD (gastroesophageal reflux disease)    Hypertension    Past Surgical History:  Procedure Laterality Date   ACHILLES TENDON SURGERY Left    1998   KNEE ARTHROSCOPY Right    2000   SHOULDER ARTHROSCOPY Right    2013   Family History  Problem Relation Age of Onset   Cancer Mother    Diabetes Mother    Colon cancer Neg Hx     Social History   Tobacco Use   Smoking status: Never   Smokeless tobacco: Never  Substance Use Topics   Alcohol use: Yes    Comment: rare- 2 drink every 2 months   Marital Status: Single  ROS  Review of Systems  Cardiovascular:  Negative for chest pain, dyspnea on exertion and leg swelling.  Gastrointestinal:  Negative for melena.  Objective  Blood pressure 127/78, pulse 67, resp. rate 16, height 5' 8"  (1.727 m), weight 203 lb 3.2 oz (92.2 kg), SpO2 96 %. Body mass index is 30.9 kg/m.  Vitals with BMI 01/07/2021  11/27/2020 11/27/2020  Height 5' 8"  - -  Weight 203 lbs 3 oz - -  BMI 89.2 - -  Systolic 119 417 408  Diastolic 78 88 80  Pulse 67 67 66     Physical Exam Neck:     Vascular: No carotid bruit or JVD.  Cardiovascular:     Rate and Rhythm: Normal rate and regular rhythm.     Pulses: Intact distal pulses.     Heart sounds: Normal heart sounds. No murmur heard.   No gallop.  Pulmonary:     Effort: Pulmonary effort is normal.     Breath sounds: Normal breath sounds.  Abdominal:     General: Bowel sounds are normal.     Palpations: Abdomen is soft.  Musculoskeletal:        General: No swelling.     Laboratory examination:   Recent Labs    11/27/20 1150  NA 138  K 4.6  CL 105  CO2 27  GLUCOSE 89  BUN 17  CREATININE 1.27*  CALCIUM 8.9  GFRNONAA >60   CrCl cannot be calculated (Patient's most recent lab result is older than the maximum 21 days allowed.).  CMP Latest Ref Rng & Units 11/27/2020  Glucose 70 - 99 mg/dL 89  BUN 8 - 23 mg/dL 17  Creatinine 0.61 - 1.24 mg/dL 1.27(H)  Sodium 135 - 145 mmol/L 138  Potassium 3.5 - 5.1 mmol/L 4.6  Chloride 98 - 111 mmol/L 105  CO2 22 - 32 mmol/L 27  Calcium 8.9 - 10.3 mg/dL 8.9  Total Protein 6.5 - 8.1 g/dL 6.8  Total Bilirubin 0.3 - 1.2 mg/dL 1.2  Alkaline Phos 38 - 126 U/L 70  AST 15 - 41 U/L 25  ALT 0 - 44 U/L 19   CBC Latest Ref Rng & Units 11/27/2020  WBC 4.0 - 10.5 K/uL 6.2  Hemoglobin 13.0 - 17.0 g/dL 15.0  Hematocrit 39.0 - 52.0 % 46.7  Platelets 150 - 400 K/uL 377   TSH Recent Labs    11/27/20 1738  TSH 1.336    External labs:   Labs 12/02/2020:  TSH 3.93, normal.  Total cholesterol 188, triglycerides 159, HDL 39, LDL 117, non-HDL cholesterol 149.  Hb 15.2/HCT 45.4, platelets 337, normal indicis.  Sodium 142, potassium 4.7, BUN 16, creatinine 1.31, EGFR 65 mL.  Serum glucose 87 mg.  Vitamin D 32.  PSA normal. Medications and allergies  No Known Allergies   Medication prior to this encounter:    Outpatient Medications Prior to Visit  Medication Sig Dispense Refill   ALPRAZolam (XANAX) 0.25 MG tablet SMARTSIG:1-2 Tablet(s) By Mouth 1-3 Times Daily PRN     Cholecalciferol (VITAMIN D3) 5000 units CAPS Take 5,000 Units by mouth daily.     diclofenac sodium (VOLTAREN) 1 % GEL Apply topically 4 (four) times daily.     ibuprofen (ADVIL) 200 MG tablet Take 200-400 mg by mouth daily as needed for mild pain.     multivitamin (ONE-A-DAY MEN'S) TABS tablet Take 1 tablet by mouth daily.     omeprazole (PRILOSEC) 40 MG capsule Take 40 mg by mouth daily as needed (for heartburn).     sertraline (ZOLOFT) 25 MG tablet Take 25 mg by mouth every morning.     valsartan (DIOVAN) 40 MG tablet Take 40 mg by mouth at bedtime.     Na Sulfate-K Sulfate-Mg Sulf (SUPREP BOWEL PREP KIT) 17.5-3.13-1.6 GM/180ML SOLN suprep as directed.  No substitutions (Patient not taking: Reported on 11/27/2020) 354 mL 0   NON FORMULARY Take 1 Scoop by mouth See admin instructions. Soylent meal replacement- Mix 1 scoopful of powder into 20 ounces of oak milk and drink one time a day     No facility-administered medications prior to visit.     Medication list after today's encounter   Current Outpatient Medications  Medication Instructions   ALPRAZolam (XANAX) 0.25 MG tablet SMARTSIG:1-2 Tablet(s) By Mouth 1-3 Times Daily PRN   diclofenac sodium (VOLTAREN) 1 % GEL Topical, 4 times daily   ibuprofen (ADVIL) 200-400 mg, Oral, Daily PRN   multivitamin (ONE-A-DAY MEN'S) TABS tablet 1 tablet, Oral, Daily   omeprazole (PRILOSEC) 40 mg, Oral, Daily PRN   sertraline (ZOLOFT) 25 mg, Oral, Every morning   valsartan (DIOVAN) 40 mg, Oral, Daily at bedtime   Vitamin D3 5,000 Units, Oral, Daily    Radiology:   Chest x-ray PA and lateral view 11/27/2020 The heart size and mediastinal contours are within normal limits. No focal airspace consolidation, pleural effusion, or pneumothorax. The visualized skeletal structures are  unremarkable.  Cardiac Studies:    NA  EKG:   EKG 01/07/2021: Normal sinus rhythm at rate of 65 bpm, normal axis.  No evidence of ischemia, normal EKG.  EKG 11/27/2020: Normal sinus rhythm at rate of 78 bpm, normal axis, no evidence of ischemia, normal EKG.  PACs (2).  Assessment     ICD-10-CM   1. Palpitations  R00.2 EKG 12-Lead    2. Primary hypertension  I10     3. PAC (premature atrial contraction)  I49.1        Medications Discontinued During This Encounter  Medication Reason   Na Sulfate-K Sulfate-Mg Sulf (SUPREP BOWEL PREP KIT) 17.5-3.13-1.6 GM/180ML SOLN Error   NON FORMULARY Error    No orders of the defined types were placed in this encounter.  Orders Placed This Encounter  Procedures   EKG 12-Lead   Recommendations:   Albert Howell is a 65 y.o. African-American male patient with history of hypertension, vitamin D deficiency, who is a non-smoker, nondiabetic referred to me for evaluation of palpitations. , EKG revealed PACs and he was discharged home with recommendation to follow-up with PCP  He is now completely asymptomatic.  He has discontinued using any kind of caffeinated drinks.  He does not smoke, he does not drink alcohol, he was also under significant amount of stress as his ex-wife recently passed.  I suspect these were the etiology for his PACs.  As his physical examination is completely normal and except for hypertension and African-American male age greater than 69 and very mild abnormality lipids other risk factors.  I have discussed with him regarding weight loss and regular exercise, he has been using stationary bicycle regularly, advised him to increase the intensity of biking on a regular basis.  Weight loss was also discussed with the patient.  With primary prevention, I suspect his lipids will improve.  His blood pressure is well controlled.  I will see him back on a as needed basis.  I did not order any tests.    Albert Prows, MD,  [facility] 01/07/2021, 12:07 PM Office: [phone]

## 2021-02-03 ENCOUNTER — Ambulatory Visit (AMBULATORY_SURGERY_CENTER): Payer: Self-pay

## 2021-02-03 ENCOUNTER — Other Ambulatory Visit: Payer: Self-pay

## 2021-02-03 VITALS — Ht 68.0 in | Wt 200.0 lb

## 2021-02-03 DIAGNOSIS — Z1211 Encounter for screening for malignant neoplasm of colon: Secondary | ICD-10-CM

## 2021-02-03 MED ORDER — PEG 3350-KCL-NA BICARB-NACL 420 G PO SOLR: 4000.0000 mL | Freq: Once | ORAL | 0 refills | Status: AC

## 2021-02-03 NOTE — Progress Notes (Signed)
Denies allergies to eggs or soy products. Denies complication of anesthesia or sedation. Denies use of weight loss medication. Denies use of O2.   Emmi instructions given for colonoscopy.  

## 2021-02-17 ENCOUNTER — Ambulatory Visit (AMBULATORY_SURGERY_CENTER): Payer: PPO | Admitting: Gastroenterology

## 2021-02-17 ENCOUNTER — Other Ambulatory Visit: Payer: Self-pay

## 2021-02-17 ENCOUNTER — Encounter: Payer: Self-pay | Admitting: Gastroenterology

## 2021-02-17 VITALS — BP 112/72 | HR 58 | Temp 98.3°F | Resp 12 | Ht 68.0 in | Wt 200.0 lb

## 2021-02-17 DIAGNOSIS — D129 Benign neoplasm of anus and anal canal: Secondary | ICD-10-CM

## 2021-02-17 DIAGNOSIS — D123 Benign neoplasm of transverse colon: Secondary | ICD-10-CM | POA: Diagnosis not present

## 2021-02-17 DIAGNOSIS — Z1211 Encounter for screening for malignant neoplasm of colon: Secondary | ICD-10-CM | POA: Diagnosis not present

## 2021-02-17 DIAGNOSIS — D120 Benign neoplasm of cecum: Secondary | ICD-10-CM

## 2021-02-17 DIAGNOSIS — D128 Benign neoplasm of rectum: Secondary | ICD-10-CM | POA: Diagnosis not present

## 2021-02-17 DIAGNOSIS — D127 Benign neoplasm of rectosigmoid junction: Secondary | ICD-10-CM | POA: Diagnosis not present

## 2021-02-17 MED ORDER — SODIUM CHLORIDE 0.9 % IV SOLN: 500.0000 mL | Freq: Once | INTRAVENOUS | Status: DC

## 2021-02-17 NOTE — Op Note (Signed)
[facility] Patient Name: Albert Howell Procedure Date: 02/17/2021 8:25 AM MRN: [id number] Endoscopist: Remo Lipps P. Havery Moros , MD Age: 64 Referring MD:  Date of Birth: 1956/01/07 Gender: Male Account #: [id number] Procedure:                Colonoscopy Indications:              Screening for colorectal malignant neoplasm Medicines:                Monitored Anesthesia Care Procedure:                Pre-Anesthesia Assessment:                           - Prior to the procedure, a History and Physical                            was performed, and patient medications and                            allergies were reviewed. The patient's tolerance of                            previous anesthesia was also reviewed. The risks                            and benefits of the procedure and the sedation                            options and risks were discussed with the patient.                            All questions were answered, and informed consent                            was obtained. Prior Anticoagulants: The patient has                            taken no previous anticoagulant or antiplatelet                            agents. ASA Grade Assessment: II - A patient with                            mild systemic disease. After reviewing the risks                            and benefits, the patient was deemed in                            satisfactory condition to undergo the procedure.                           After obtaining informed consent, the colonoscope  was passed under direct vision. Throughout the                            procedure, the patient's blood pressure, pulse, and                            oxygen saturations were monitored continuously. The                            Olympus CF-HQ190L ([id number]) Colonoscope was                            introduced through the anus and advanced to the the                            cecum,  identified by appendiceal orifice and                            ileocecal valve. The colonoscopy was performed                            without difficulty. The patient tolerated the                            procedure well. Scope In: 8:30:00 AM Scope Out: 8:55:37 AM Scope Withdrawal Time: 0 hours 21 minutes 37 seconds  Total Procedure Duration: 0 hours 25 minutes 37 seconds  Findings:                 The perianal and digital rectal examinations were                            normal.                           A diminutive polyp was found in the cecum. The                            polyp was sessile. The polyp was removed with a                            cold snare. Resection and retrieval were complete.                           A 3 to 4 mm polyp was found in the transverse                            colon. The polyp was sessile. The polyp was removed                            with a cold snare. Resection and retrieval were                            complete.  A 10 mm polyp was found in the recto-sigmoid colon.                            The polyp was pedunculated. The polyp was removed                            with a hot snare. Resection and retrieval were                            complete.                           A 3 mm polyp was found in the recto-sigmoid colon.                            The polyp was sessile. The polyp was removed with a                            cold snare. Resection and retrieval were complete.                           A few small-mouthed diverticula were found in the                            sigmoid colon.                           A 3 mm polyp was found in the rectum. The polyp was                            sessile. The polyp was removed with a cold snare.                            Resection and retrieval were complete.                           Internal hemorrhoids were found during retroflexion.                            The exam was otherwise without abnormality. Complications:            No immediate complications. Estimated blood loss:                            Minimal. Estimated Blood Loss:     Estimated blood loss was minimal. Impression:               - One diminutive polyp in the cecum, removed with a                            cold snare. Resected and retrieved.                           - One 3 to 4 mm polyp in the transverse  colon,                            removed with a cold snare. Resected and retrieved.                           - One 10 mm polyp at the recto-sigmoid colon,                            removed with a hot snare. Resected and retrieved.                           - One 3 mm polyp at the recto-sigmoid colon,                            removed with a cold snare. Resected and retrieved.                           - Diverticulosis in the sigmoid colon.                           - One 3 mm polyp in the rectum, removed with a cold                            snare. Resected and retrieved.                           - Internal hemorrhoids.                           - The examination was otherwise normal. Recommendation:           - Patient has a contact number available for                            emergencies. The signs and symptoms of potential                            delayed complications were discussed with the                            patient. Return to normal activities tomorrow.                            Written discharge instructions were provided to the                            patient.                           - Resume previous diet.                           - Continue present medications.                           -  Await pathology results. Remo Lipps P. Alee Gressman, MD 02/17/2021 9:10:44 AM This report has been signed electronically.

## 2021-02-17 NOTE — Patient Instructions (Signed)
Handouts on Diverticulosis, Hemorrhoids, and polyps given.  YOU HAD AN ENDOSCOPIC PROCEDURE TODAY AT [facility]:   Refer to the procedure report that was given to you for any specific questions about what was found during the examination.  If the procedure report does not answer your questions, please call your gastroenterologist to clarify.  If you requested that your care partner not be given the details of your procedure findings, then the procedure report has been included in a sealed envelope for you to review at your convenience later.  YOU SHOULD EXPECT: Some feelings of bloating in the abdomen. Passage of more gas than usual.  Walking can help get rid of the air that was put into your GI tract during the procedure and reduce the bloating. If you had a lower endoscopy (such as a colonoscopy or flexible sigmoidoscopy) you may notice spotting of blood in your stool or on the toilet paper. If you underwent a bowel prep for your procedure, you may not have a normal bowel movement for a few days.  Please Note:  You might notice some irritation and congestion in your nose or some drainage.  This is from the oxygen used during your procedure.  There is no need for concern and it should clear up in a day or so.  SYMPTOMS TO REPORT IMMEDIATELY:  Following lower endoscopy (colonoscopy or flexible sigmoidoscopy):  Excessive amounts of blood in the stool  Significant tenderness or worsening of abdominal pains  Swelling of the abdomen that is new, acute  Fever of 100F or higher   For urgent or emergent issues, a gastroenterologist can be reached at any hour by calling [phone]. Do not use MyChart messaging for urgent concerns.    DIET:  We do recommend a small meal at first, but then you may proceed to your regular diet.  Drink plenty of fluids but you should avoid alcoholic beverages for 24 hours.  ACTIVITY:  You should plan to take it easy for the rest of today and you  should NOT DRIVE or use heavy machinery until tomorrow (because of the sedation medicines used during the test).    FOLLOW UP: Our staff will call the number listed on your records 48-72 hours following your procedure to check on you and address any questions or concerns that you may have regarding the information given to you following your procedure. If we do not reach you, we will leave a message.  We will attempt to reach you two times.  During this call, we will ask if you have developed any symptoms of COVID 19. If you develop any symptoms (ie: fever, flu-like symptoms, shortness of breath, cough etc.) before then, please call [phone].  If you test positive for Covid 19 in the 2 weeks post procedure, please call and report this information to [place].    If any biopsies were taken you will be contacted by phone or by letter within the next 1-3 weeks.  Please call us at [phone] if you have not heard about the biopsies in 3 weeks.    SIGNATURES/CONFIDENTIALITY: You and/or your care partner have signed paperwork which will be entered into your electronic medical record.  These signatures attest to the fact that that the information above on your After Visit Summary has been reviewed and is understood.  Full responsibility of the confidentiality of this discharge information lies with you and/or your care-partner.

## 2021-02-17 NOTE — Progress Notes (Signed)
Pt's states no medical or surgical changes since previsit or office visit. 

## 2021-02-17 NOTE — Progress Notes (Signed)
To PACU, VSS. Report to [email or web address] 

## 2021-02-17 NOTE — Progress Notes (Signed)
[facility] History and Physical   Primary Care Physician:  Fanny Bien, MD   Reason for Procedure:   Colon cancer screening  Plan:    colonoscopy     HPI: Albert Howell is a 65 y.o. male  here for colonoscopy screening - last exam > 10 years ago, unknown per patient. Patient denies any bowel symptoms at this time. No family history of colon cancer known. Otherwise feels well without any cardiopulmonary symptoms.    Past Medical History:  Diagnosis Date   Allergy    Arthritis    Cataract    GERD (gastroesophageal reflux disease)    Hypertension    Tuberculosis     Past Surgical History:  Procedure Laterality Date   ACHILLES TENDON SURGERY Left    1998   KNEE ARTHROSCOPY Right    2000   SHOULDER ARTHROSCOPY Right    2013    Prior to Admission medications   Medication Sig Start Date End Date Taking? Authorizing Provider  Cholecalciferol (VITAMIN D3) 5000 units CAPS Take 5,000 Units by mouth daily.   Yes [provider]  ALPRAZolam Duanne Moron) 0.25 MG tablet SMARTSIG:1-2 Tablet(s) By Mouth 1-3 Times Daily PRN 12/01/20   [provider]  diclofenac sodium (VOLTAREN) 1 % GEL Apply topically 4 (four) times daily.    [provider]  ibuprofen (ADVIL) 200 MG tablet Take 200-400 mg by mouth daily as needed for mild pain.    [provider]  multivitamin (ONE-A-DAY MEN'S) TABS tablet Take 1 tablet by mouth daily.    [provider]  omeprazole (PRILOSEC) 40 MG capsule Take 40 mg by mouth daily as needed (for heartburn).    [provider]  sertraline (ZOLOFT) 25 MG tablet Take 25 mg by mouth every morning. 12/01/20   [provider]  valsartan (DIOVAN) 40 MG tablet Take 40 mg by mouth at bedtime.    [provider]    Current Outpatient Medications  Medication Sig Dispense Refill   Cholecalciferol (VITAMIN D3) 5000 units CAPS Take 5,000 Units by mouth daily.     ALPRAZolam (XANAX) 0.25 MG  tablet SMARTSIG:1-2 Tablet(s) By Mouth 1-3 Times Daily PRN     diclofenac sodium (VOLTAREN) 1 % GEL Apply topically 4 (four) times daily.     ibuprofen (ADVIL) 200 MG tablet Take 200-400 mg by mouth daily as needed for mild pain.     multivitamin (ONE-A-DAY MEN'S) TABS tablet Take 1 tablet by mouth daily.     omeprazole (PRILOSEC) 40 MG capsule Take 40 mg by mouth daily as needed (for heartburn).     sertraline (ZOLOFT) 25 MG tablet Take 25 mg by mouth every morning.     valsartan (DIOVAN) 40 MG tablet Take 40 mg by mouth at bedtime.     Current Facility-Administered Medications  Medication Dose Route Frequency Provider Last Rate Last Admin   0.9 %  sodium chloride infusion  500 mL Intravenous Once Licet Dunphy, Carlota Raspberry, MD        Allergies as of 02/17/2021   (No Known Allergies)    Family History  Problem Relation Age of Onset   Cancer Mother    Diabetes Mother    Colon cancer Neg Hx    Esophageal cancer Neg Hx    Rectal cancer Neg Hx    Stomach cancer Neg Hx     Social History   Socioeconomic History   Marital status: Single    Spouse name: Not on file  Number of children: Not on file   Years of education: Not on file   Highest education level: Not on file  Occupational History   Not on file  Tobacco Use   Smoking status: Never   Smokeless tobacco: Never  Vaping Use   Vaping Use: Never used  Substance and Sexual Activity   Alcohol use: Yes    Comment: rare- 2 drink every 2 months   Drug use: No   Sexual activity: Not on file  Other Topics Concern   Not on file  Social History Narrative   Not on file   Social Determinants of Health   Financial Resource Strain: Not on file  Food Insecurity: Not on file  Transportation Needs: Not on file  Physical Activity: Not on file  Stress: Not on file  Social Connections: Not on file  Intimate Partner Violence: Not on file    Review of Systems: All other review of systems negative except as mentioned in the  HPI.  Physical Exam: Vital signs BP 128/79    Pulse 65    Temp 98.3 F (36.8 C)    Resp 15    Ht 5\' 8"  (1.727 m)    Wt 200 lb (90.7 kg)    SpO2 98%    BMI 30.41 kg/m   General:   Alert,  Well-developed, pleasant and cooperative in NAD Lungs:  Clear throughout to auscultation.   Heart:  Regular rate and rhythm Abdomen:  Soft, nontender and nondistended.   Neuro/Psych:  Alert and cooperative. Normal mood and affect. A and O x 3  Jolly Mango, MD [facility]

## 2021-02-17 NOTE — Progress Notes (Signed)
Called to room to assist during endoscopic procedure.  Patient ID and intended procedure confirmed with present staff. Received instructions for my participation in the procedure from the performing physician.  

## 2021-02-19 ENCOUNTER — Telehealth: Payer: Self-pay

## 2021-02-19 NOTE — Telephone Encounter (Signed)
Left message on answering machine. 

## 2021-03-10 DIAGNOSIS — N289 Disorder of kidney and ureter, unspecified: Secondary | ICD-10-CM | POA: Diagnosis not present

## 2021-03-10 DIAGNOSIS — E782 Mixed hyperlipidemia: Secondary | ICD-10-CM | POA: Diagnosis not present

## 2021-03-10 DIAGNOSIS — I10 Essential (primary) hypertension: Secondary | ICD-10-CM | POA: Diagnosis not present

## 2021-03-16 DIAGNOSIS — E782 Mixed hyperlipidemia: Secondary | ICD-10-CM | POA: Diagnosis not present

## 2021-03-16 DIAGNOSIS — I10 Essential (primary) hypertension: Secondary | ICD-10-CM | POA: Diagnosis not present

## 2021-03-16 DIAGNOSIS — R7303 Prediabetes: Secondary | ICD-10-CM | POA: Diagnosis not present

## 2021-04-07 DIAGNOSIS — U071 COVID-19: Secondary | ICD-10-CM | POA: Diagnosis not present

## 2021-04-28 DIAGNOSIS — J069 Acute upper respiratory infection, unspecified: Secondary | ICD-10-CM | POA: Diagnosis not present

## 2021-04-28 DIAGNOSIS — B9689 Other specified bacterial agents as the cause of diseases classified elsewhere: Secondary | ICD-10-CM | POA: Diagnosis not present

## 2021-04-28 DIAGNOSIS — J329 Chronic sinusitis, unspecified: Secondary | ICD-10-CM | POA: Diagnosis not present

## 2021-05-24 DIAGNOSIS — F411 Generalized anxiety disorder: Secondary | ICD-10-CM | POA: Diagnosis not present

## 2021-05-24 DIAGNOSIS — F331 Major depressive disorder, recurrent, moderate: Secondary | ICD-10-CM | POA: Diagnosis not present

## 2021-05-24 DIAGNOSIS — J40 Bronchitis, not specified as acute or chronic: Secondary | ICD-10-CM | POA: Diagnosis not present

## 2021-05-24 DIAGNOSIS — U071 COVID-19: Secondary | ICD-10-CM | POA: Diagnosis not present

## 2021-05-24 DIAGNOSIS — K219 Gastro-esophageal reflux disease without esophagitis: Secondary | ICD-10-CM | POA: Diagnosis not present

## 2021-05-24 DIAGNOSIS — G4700 Insomnia, unspecified: Secondary | ICD-10-CM | POA: Diagnosis not present

## 2021-05-24 DIAGNOSIS — J45909 Unspecified asthma, uncomplicated: Secondary | ICD-10-CM | POA: Diagnosis not present

## 2021-06-01 DIAGNOSIS — H04123 Dry eye syndrome of bilateral lacrimal glands: Secondary | ICD-10-CM | POA: Diagnosis not present

## 2021-06-01 DIAGNOSIS — H25813 Combined forms of age-related cataract, bilateral: Secondary | ICD-10-CM | POA: Diagnosis not present

## 2021-06-01 DIAGNOSIS — H35363 Drusen (degenerative) of macula, bilateral: Secondary | ICD-10-CM | POA: Diagnosis not present

## 2021-06-01 DIAGNOSIS — H524 Presbyopia: Secondary | ICD-10-CM | POA: Diagnosis not present

## 2021-06-01 DIAGNOSIS — H43812 Vitreous degeneration, left eye: Secondary | ICD-10-CM | POA: Diagnosis not present

## 2021-06-07 DIAGNOSIS — K219 Gastro-esophageal reflux disease without esophagitis: Secondary | ICD-10-CM | POA: Diagnosis not present

## 2021-06-07 DIAGNOSIS — J45990 Exercise induced bronchospasm: Secondary | ICD-10-CM | POA: Diagnosis not present

## 2021-06-07 DIAGNOSIS — J45909 Unspecified asthma, uncomplicated: Secondary | ICD-10-CM | POA: Diagnosis not present

## 2021-06-07 DIAGNOSIS — R3915 Urgency of urination: Secondary | ICD-10-CM | POA: Diagnosis not present

## 2021-06-07 DIAGNOSIS — R351 Nocturia: Secondary | ICD-10-CM | POA: Diagnosis not present

## 2021-06-09 DIAGNOSIS — E119 Type 2 diabetes mellitus without complications: Secondary | ICD-10-CM | POA: Diagnosis not present

## 2021-06-09 DIAGNOSIS — E559 Vitamin D deficiency, unspecified: Secondary | ICD-10-CM | POA: Diagnosis not present

## 2021-06-09 DIAGNOSIS — R350 Frequency of micturition: Secondary | ICD-10-CM | POA: Diagnosis not present

## 2021-06-09 DIAGNOSIS — E782 Mixed hyperlipidemia: Secondary | ICD-10-CM | POA: Diagnosis not present

## 2021-06-09 DIAGNOSIS — Z125 Encounter for screening for malignant neoplasm of prostate: Secondary | ICD-10-CM | POA: Diagnosis not present

## 2021-06-14 DIAGNOSIS — E782 Mixed hyperlipidemia: Secondary | ICD-10-CM | POA: Diagnosis not present

## 2021-06-14 DIAGNOSIS — I10 Essential (primary) hypertension: Secondary | ICD-10-CM | POA: Diagnosis not present

## 2021-06-14 DIAGNOSIS — E559 Vitamin D deficiency, unspecified: Secondary | ICD-10-CM | POA: Diagnosis not present

## 2021-06-14 DIAGNOSIS — R7303 Prediabetes: Secondary | ICD-10-CM | POA: Diagnosis not present

## 2021-07-22 DIAGNOSIS — B9689 Other specified bacterial agents as the cause of diseases classified elsewhere: Secondary | ICD-10-CM | POA: Diagnosis not present

## 2021-07-22 DIAGNOSIS — J069 Acute upper respiratory infection, unspecified: Secondary | ICD-10-CM | POA: Diagnosis not present

## 2021-07-22 DIAGNOSIS — J329 Chronic sinusitis, unspecified: Secondary | ICD-10-CM | POA: Diagnosis not present

## 2021-08-04 DIAGNOSIS — J069 Acute upper respiratory infection, unspecified: Secondary | ICD-10-CM | POA: Diagnosis not present

## 2021-08-04 DIAGNOSIS — B9689 Other specified bacterial agents as the cause of diseases classified elsewhere: Secondary | ICD-10-CM | POA: Diagnosis not present

## 2021-08-04 DIAGNOSIS — J329 Chronic sinusitis, unspecified: Secondary | ICD-10-CM | POA: Diagnosis not present

## 2021-08-12 DIAGNOSIS — J3489 Other specified disorders of nose and nasal sinuses: Secondary | ICD-10-CM | POA: Diagnosis not present

## 2021-08-12 DIAGNOSIS — B9689 Other specified bacterial agents as the cause of diseases classified elsewhere: Secondary | ICD-10-CM | POA: Diagnosis not present

## 2021-08-12 DIAGNOSIS — R0981 Nasal congestion: Secondary | ICD-10-CM | POA: Diagnosis not present

## 2021-08-12 DIAGNOSIS — J329 Chronic sinusitis, unspecified: Secondary | ICD-10-CM | POA: Diagnosis not present

## 2021-08-12 DIAGNOSIS — J069 Acute upper respiratory infection, unspecified: Secondary | ICD-10-CM | POA: Diagnosis not present

## 2021-09-20 DIAGNOSIS — E119 Type 2 diabetes mellitus without complications: Secondary | ICD-10-CM | POA: Diagnosis not present

## 2021-09-20 DIAGNOSIS — E782 Mixed hyperlipidemia: Secondary | ICD-10-CM | POA: Diagnosis not present

## 2021-09-20 DIAGNOSIS — I10 Essential (primary) hypertension: Secondary | ICD-10-CM | POA: Diagnosis not present

## 2021-09-22 DIAGNOSIS — B9689 Other specified bacterial agents as the cause of diseases classified elsewhere: Secondary | ICD-10-CM | POA: Diagnosis not present

## 2021-09-22 DIAGNOSIS — E782 Mixed hyperlipidemia: Secondary | ICD-10-CM | POA: Diagnosis not present

## 2021-09-22 DIAGNOSIS — J329 Chronic sinusitis, unspecified: Secondary | ICD-10-CM | POA: Diagnosis not present

## 2021-09-22 DIAGNOSIS — F411 Generalized anxiety disorder: Secondary | ICD-10-CM | POA: Diagnosis not present

## 2021-09-22 DIAGNOSIS — G4700 Insomnia, unspecified: Secondary | ICD-10-CM | POA: Diagnosis not present

## 2021-09-22 DIAGNOSIS — J019 Acute sinusitis, unspecified: Secondary | ICD-10-CM | POA: Diagnosis not present

## 2021-09-22 DIAGNOSIS — F331 Major depressive disorder, recurrent, moderate: Secondary | ICD-10-CM | POA: Diagnosis not present

## 2021-09-22 DIAGNOSIS — I10 Essential (primary) hypertension: Secondary | ICD-10-CM | POA: Diagnosis not present

## 2021-09-24 DIAGNOSIS — J324 Chronic pansinusitis: Secondary | ICD-10-CM | POA: Diagnosis not present

## 2021-09-28 DIAGNOSIS — J069 Acute upper respiratory infection, unspecified: Secondary | ICD-10-CM | POA: Diagnosis not present

## 2021-09-28 DIAGNOSIS — J329 Chronic sinusitis, unspecified: Secondary | ICD-10-CM | POA: Diagnosis not present

## 2021-09-28 DIAGNOSIS — J309 Allergic rhinitis, unspecified: Secondary | ICD-10-CM | POA: Diagnosis not present

## 2021-09-28 DIAGNOSIS — B9689 Other specified bacterial agents as the cause of diseases classified elsewhere: Secondary | ICD-10-CM | POA: Diagnosis not present

## 2021-10-04 DIAGNOSIS — Z0000 Encounter for general adult medical examination without abnormal findings: Secondary | ICD-10-CM | POA: Diagnosis not present

## 2021-10-04 DIAGNOSIS — Z1331 Encounter for screening for depression: Secondary | ICD-10-CM | POA: Diagnosis not present

## 2021-10-04 DIAGNOSIS — Z1339 Encounter for screening examination for other mental health and behavioral disorders: Secondary | ICD-10-CM | POA: Diagnosis not present

## 2021-11-11 DIAGNOSIS — J324 Chronic pansinusitis: Secondary | ICD-10-CM | POA: Diagnosis not present

## 2021-11-11 DIAGNOSIS — J309 Allergic rhinitis, unspecified: Secondary | ICD-10-CM | POA: Diagnosis not present

## 2021-11-12 DIAGNOSIS — Z23 Encounter for immunization: Secondary | ICD-10-CM | POA: Diagnosis not present

## 2021-12-15 DIAGNOSIS — Z23 Encounter for immunization: Secondary | ICD-10-CM | POA: Diagnosis not present

## 2022-02-25 DIAGNOSIS — J330 Polyp of nasal cavity: Secondary | ICD-10-CM | POA: Diagnosis not present

## 2022-02-25 DIAGNOSIS — J309 Allergic rhinitis, unspecified: Secondary | ICD-10-CM | POA: Diagnosis not present

## 2022-03-24 IMAGING — CT CT HEAD W/O CM
4 series · 16 of 47 positions shown, 18 images · non-contrast
Comparison: CT Head 09/07/2016.

CLINICAL DATA: decreased sensation to LUE and LLE since last night
around 9 PM.

EXAM:
CT HEAD WITHOUT CONTRAST
TECHNIQUE: Contiguous axial images were obtained from the base of the skull
through the vertex without intravenous contrast.

[Series 3: head wo · axial · 0.45mm/px · z∈[-106,+24]mm · 7 of 36 slices shown, 9 images]
[im 5/36  brain]
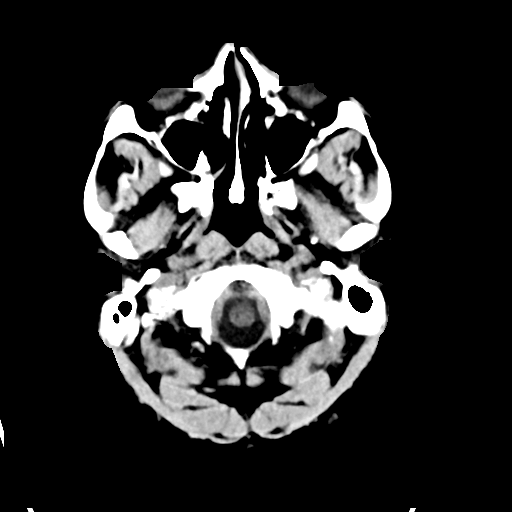
[im 5/36  bone]
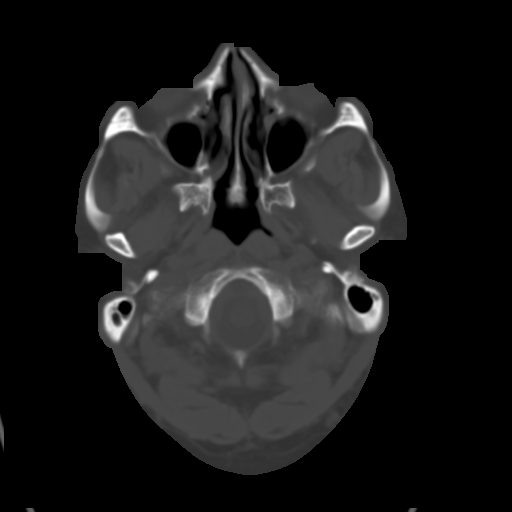
[im 9/36  brain]
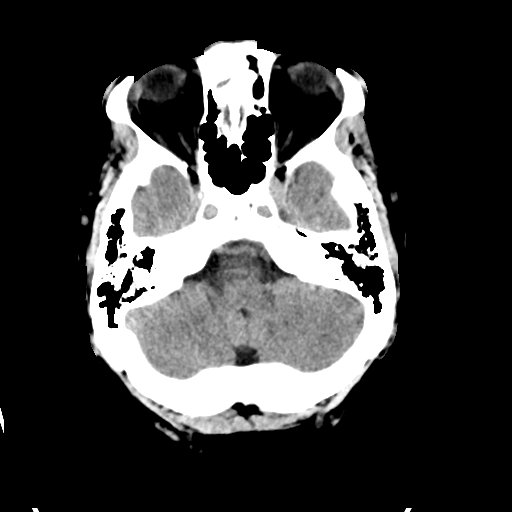
[im 14/36  brain]
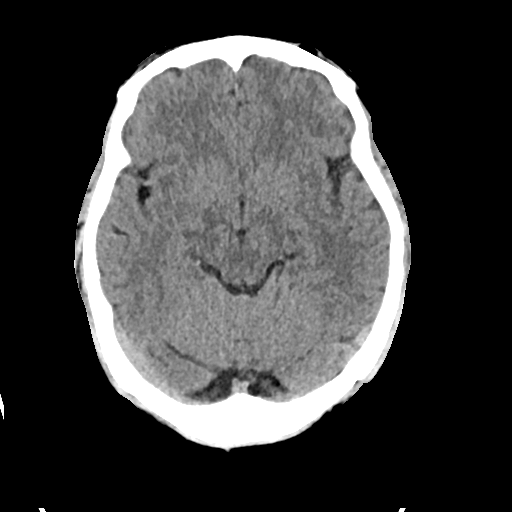
[im 18/36  brain]
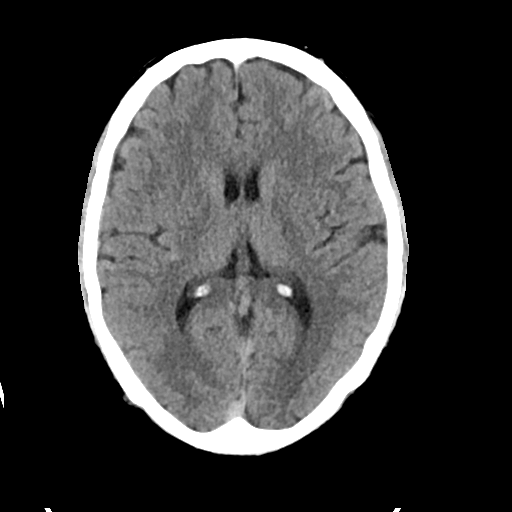
[im 22/36  brain]
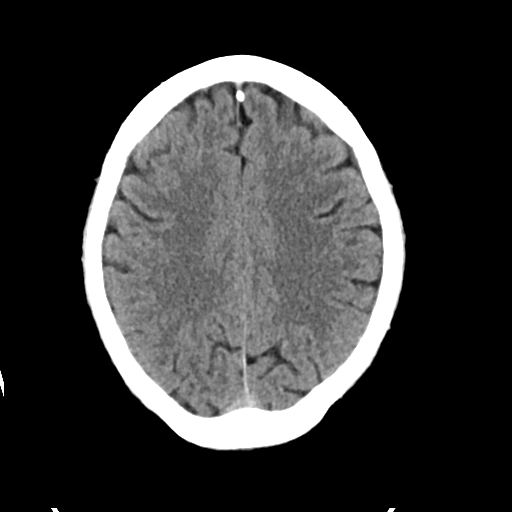
[im 22/36  bone]
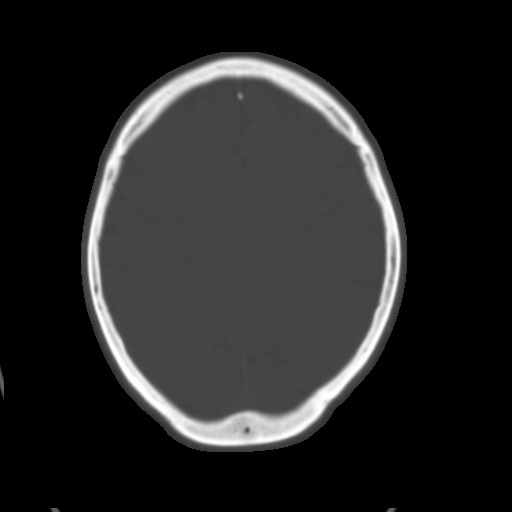
[im 27/36  brain]
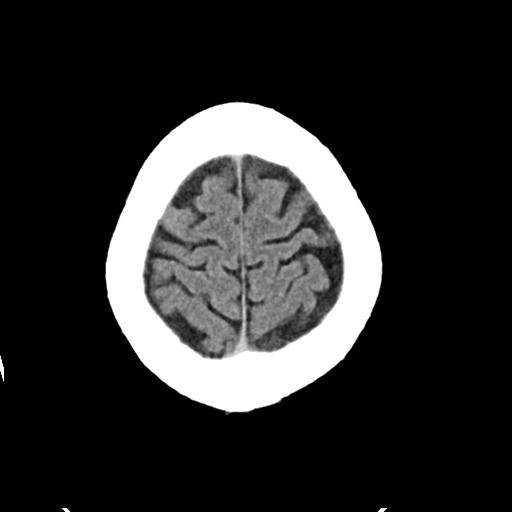
[im 31/36  brain]
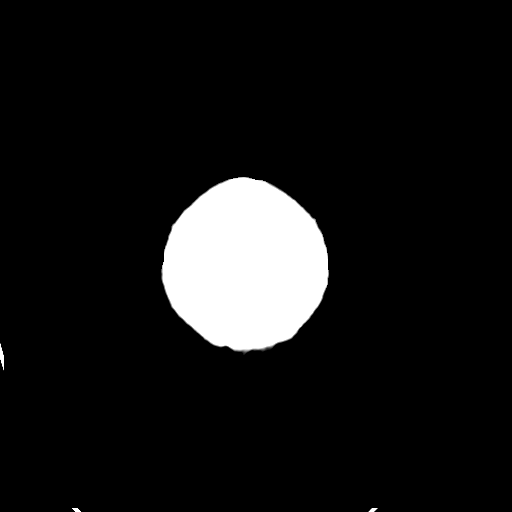

[Series 4: head bone · axial · 0.45mm/px · z∈[-110,-74]mm · 3 of 88 slices shown]
[im 9/88  bone]
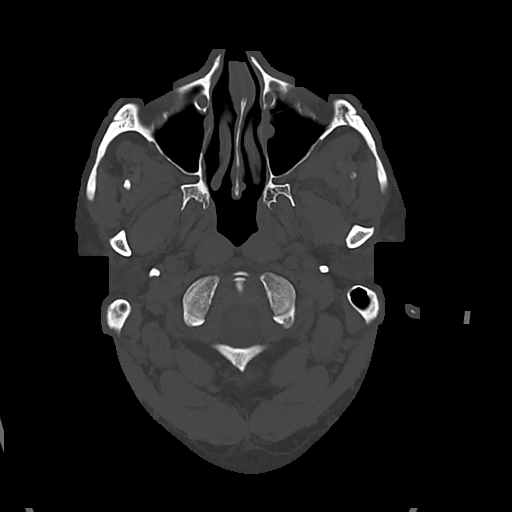
[im 18/88  bone]
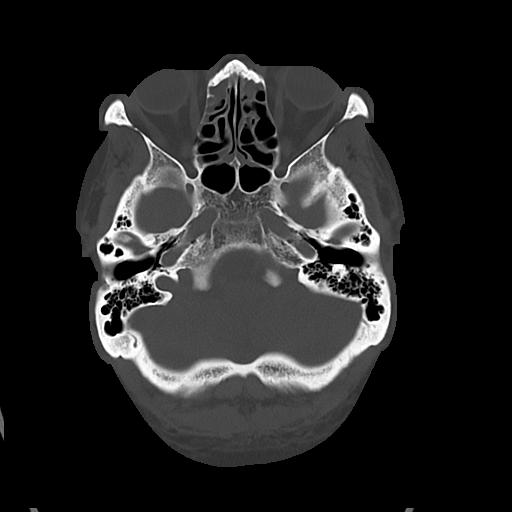
[im 27/88  bone]
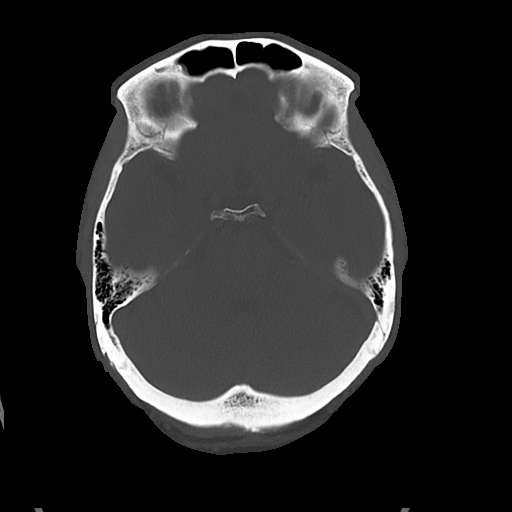

[Series 5: cor soft · coronal · 0.33mm/px · 3 of 72 slices shown]
[im 24/72  brain]
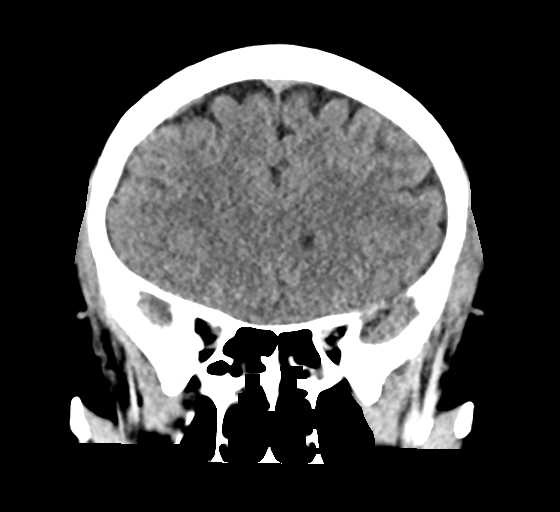
[im 32/72  brain]
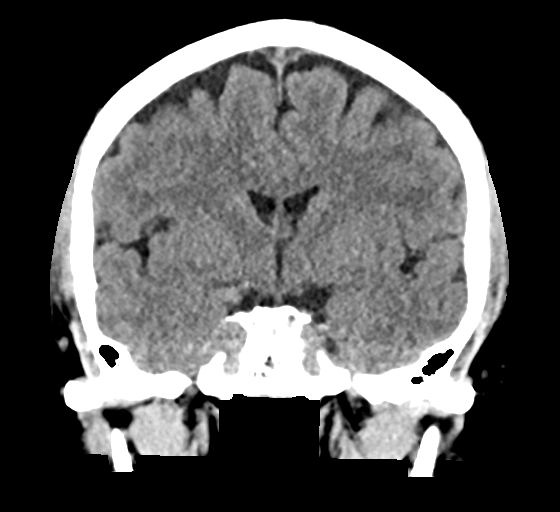
[im 40/72  brain]
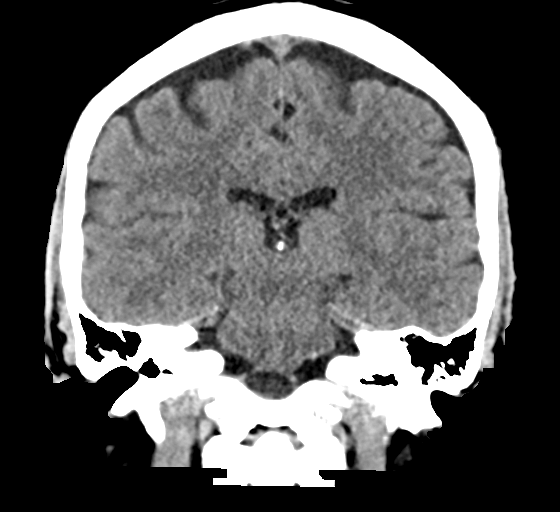

[Series 6: sag soft · sagittal · 0.33mm/px · 3 of 62 slices shown]
[im 21/62  brain]
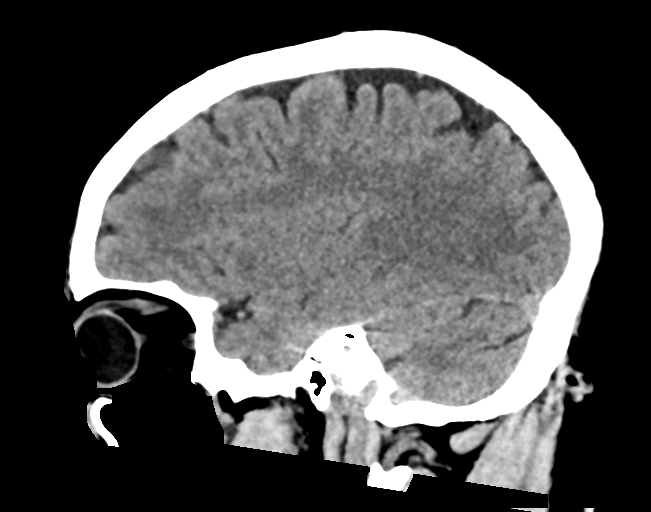
[im 31/62  brain]
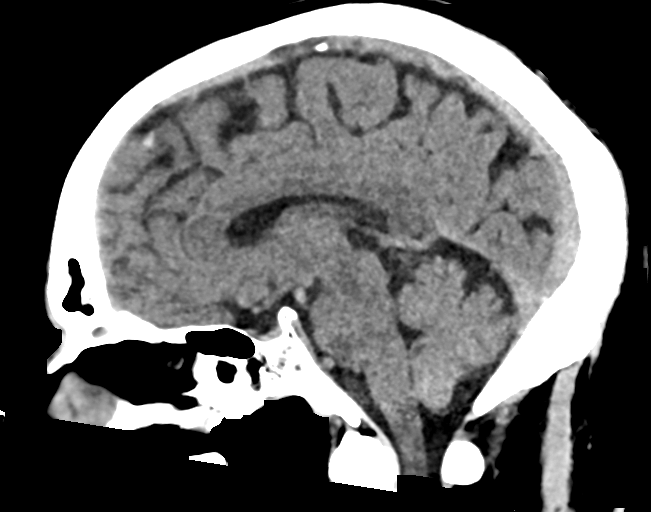
[im 41/62  brain]
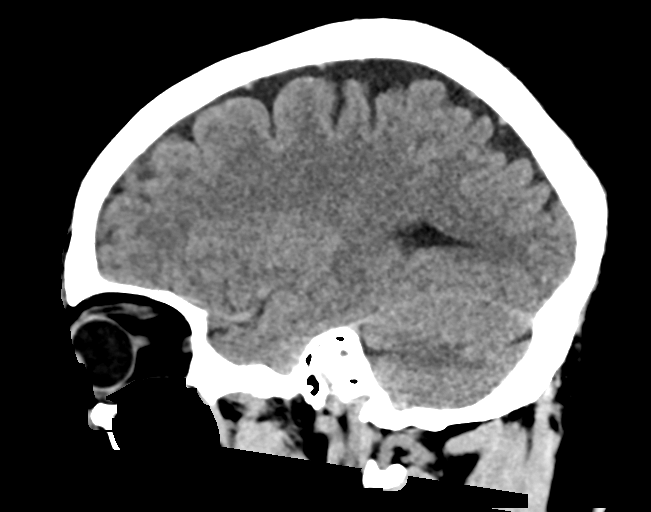

[16 of 47 positions shown; findings below may reference images not displayed]

FINDINGS: Brain: No evidence of acute large vascular territory infarction,
hemorrhage, hydrocephalus, extra-axial collection or mass
lesion/mass effect.

Vascular: No hyperdense vessel identified. Mild calcific
intracranial atherosclerosis.

Skull: No acute fracture.

Sinuses/Orbits: Moderate paranasal sinus mucosal thickening with
probable retention cyst in the left maxillary sinus.

Other: No mastoid effusions.
IMPRESSION: 1. No evidence of acute intracranial abnormality.
2. Moderate paranasal sinus mucosal thickening.

## 2022-03-24 IMAGING — CR DG CHEST 2V
2 series · 2 of 2 positions shown · non-contrast
Comparison: None.

CLINICAL DATA: Chest pain, irregular heartbeat

EXAM:
CHEST - 2 VIEW

[chest pa]
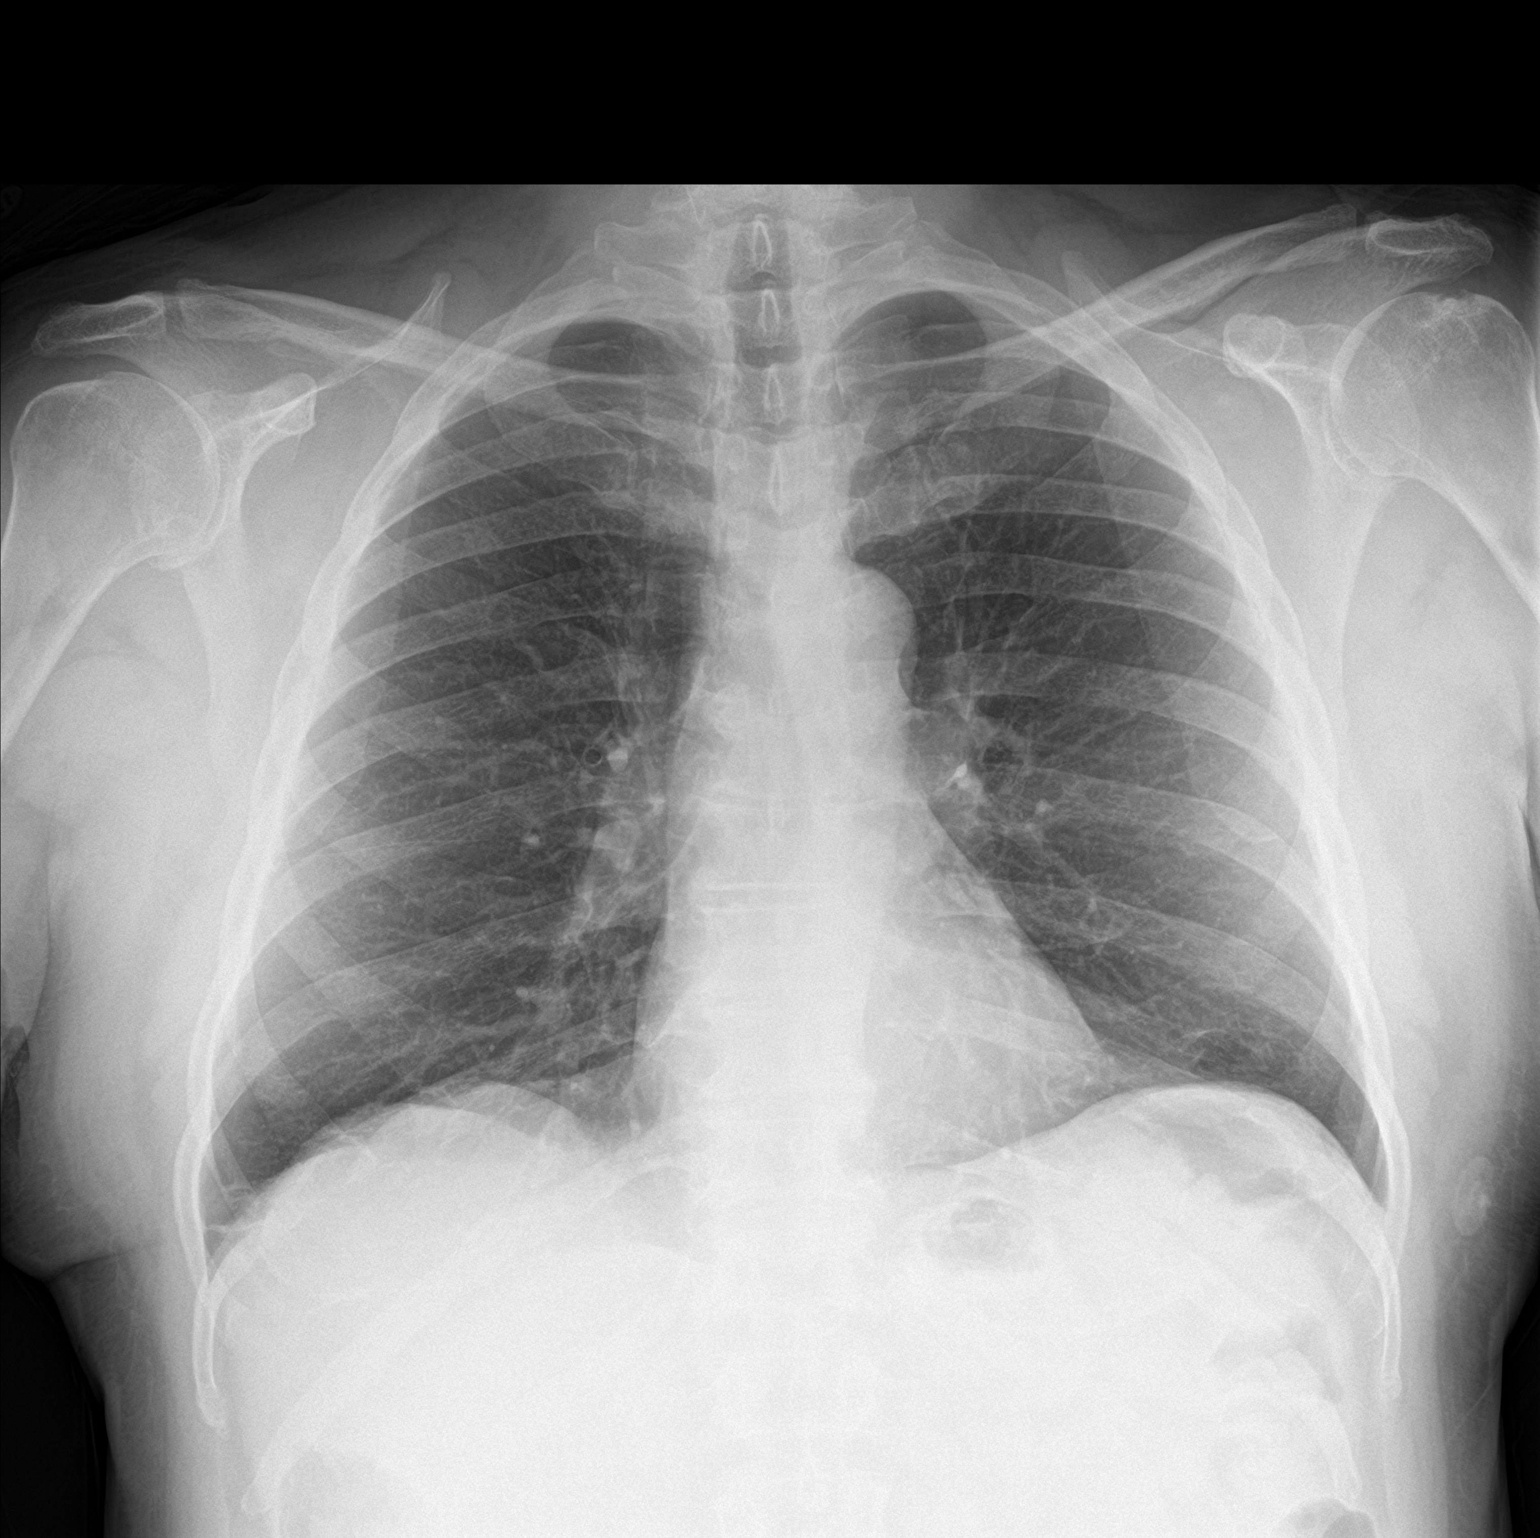

[chest lat]
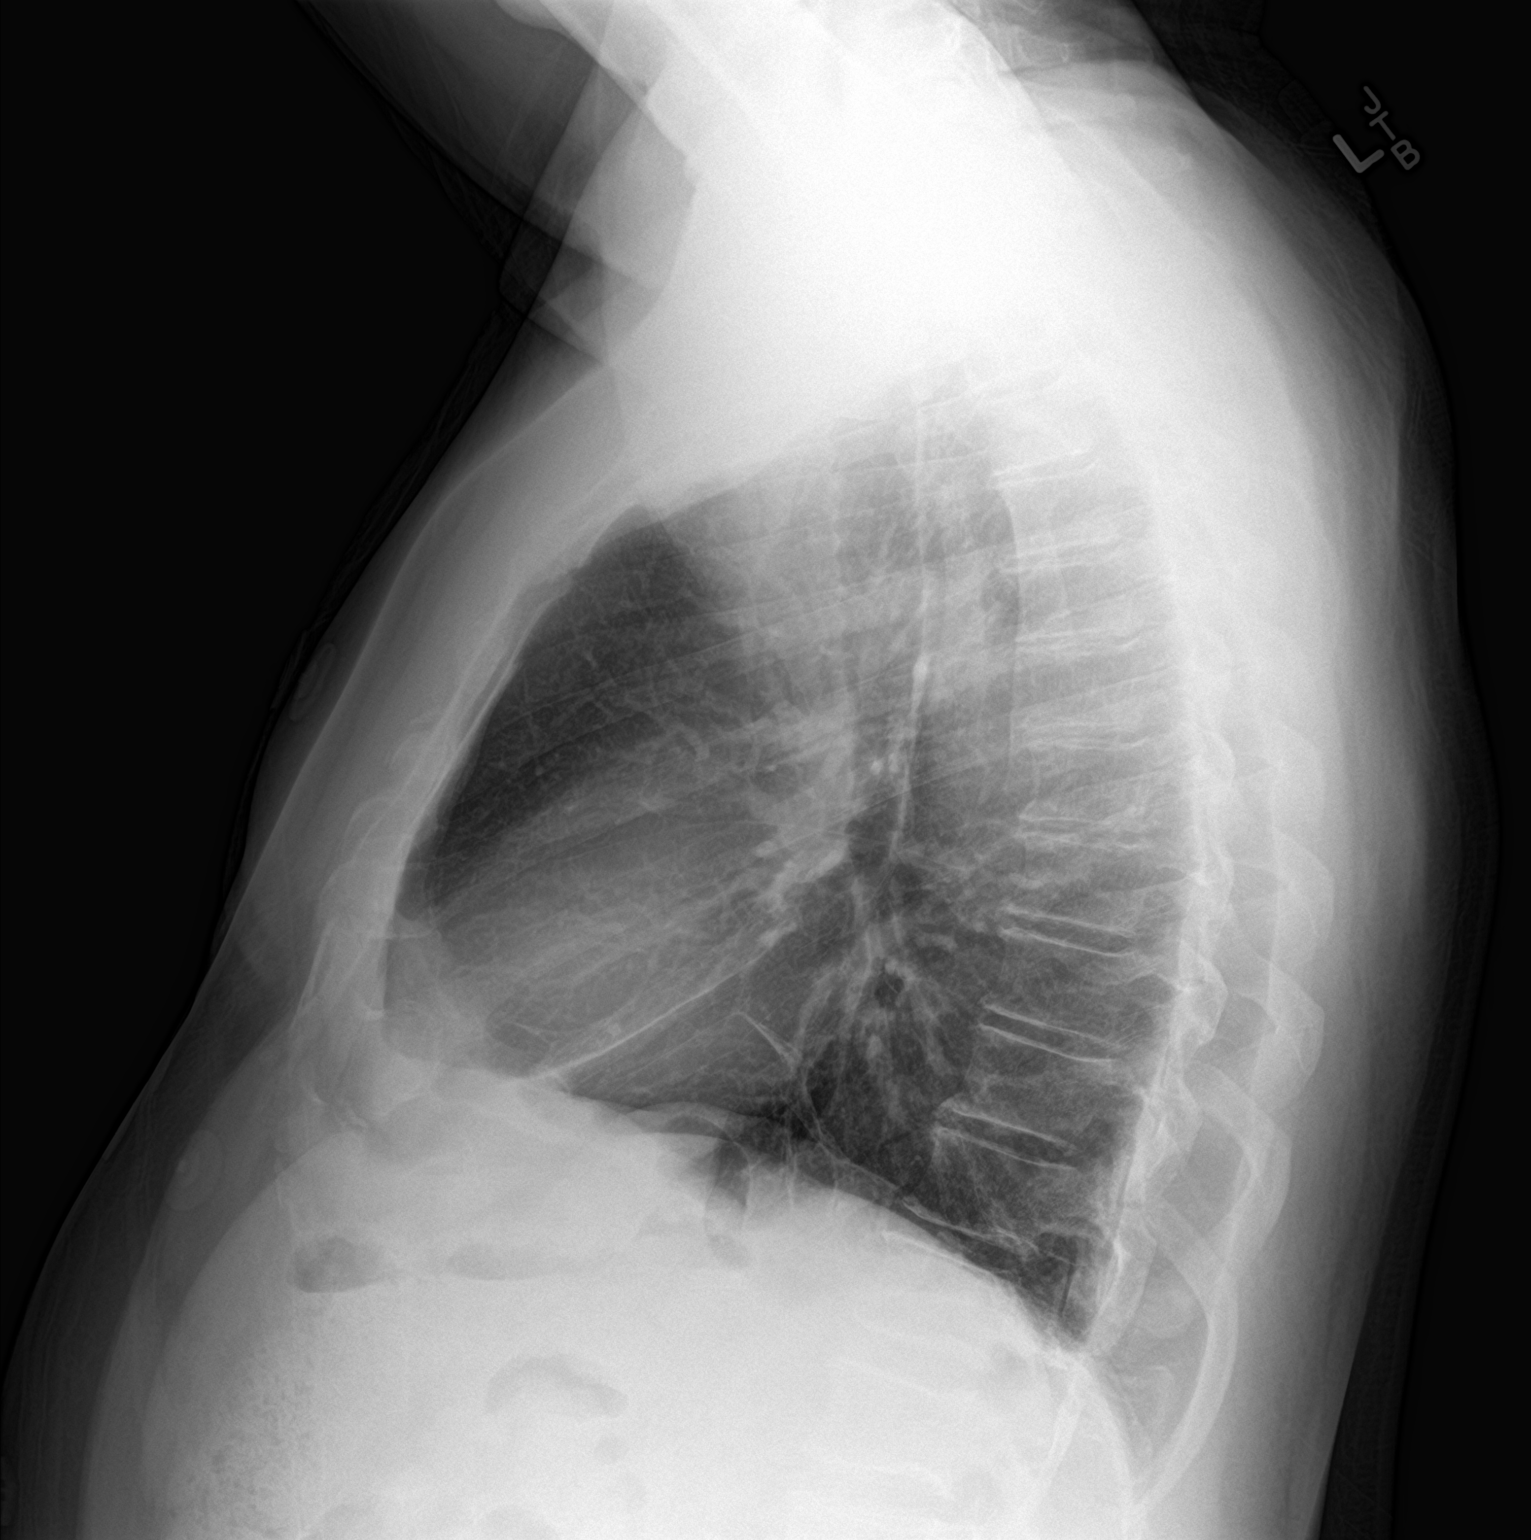

[2 of 2 positions shown; findings below may reference images not displayed]

FINDINGS: The heart size and mediastinal contours are within normal limits. No
focal airspace consolidation, pleural effusion, or pneumothorax. The
visualized skeletal structures are unremarkable.
IMPRESSION: No active cardiopulmonary disease.

## 2022-06-02 DIAGNOSIS — Z1159 Encounter for screening for other viral diseases: Secondary | ICD-10-CM | POA: Diagnosis not present

## 2022-06-02 DIAGNOSIS — J302 Other seasonal allergic rhinitis: Secondary | ICD-10-CM | POA: Diagnosis not present

## 2022-06-02 DIAGNOSIS — J40 Bronchitis, not specified as acute or chronic: Secondary | ICD-10-CM | POA: Diagnosis not present

## 2022-07-08 DIAGNOSIS — R5383 Other fatigue: Secondary | ICD-10-CM | POA: Diagnosis not present

## 2022-07-08 DIAGNOSIS — E559 Vitamin D deficiency, unspecified: Secondary | ICD-10-CM | POA: Diagnosis not present

## 2022-07-08 DIAGNOSIS — Z125 Encounter for screening for malignant neoplasm of prostate: Secondary | ICD-10-CM | POA: Diagnosis not present

## 2022-07-08 DIAGNOSIS — E782 Mixed hyperlipidemia: Secondary | ICD-10-CM | POA: Diagnosis not present

## 2022-07-08 DIAGNOSIS — I10 Essential (primary) hypertension: Secondary | ICD-10-CM | POA: Diagnosis not present

## 2022-07-20 DIAGNOSIS — E559 Vitamin D deficiency, unspecified: Secondary | ICD-10-CM | POA: Diagnosis not present

## 2022-07-20 DIAGNOSIS — J45909 Unspecified asthma, uncomplicated: Secondary | ICD-10-CM | POA: Diagnosis not present

## 2022-07-20 DIAGNOSIS — I10 Essential (primary) hypertension: Secondary | ICD-10-CM | POA: Diagnosis not present

## 2022-07-20 DIAGNOSIS — J309 Allergic rhinitis, unspecified: Secondary | ICD-10-CM | POA: Diagnosis not present

## 2022-07-20 DIAGNOSIS — K219 Gastro-esophageal reflux disease without esophagitis: Secondary | ICD-10-CM | POA: Diagnosis not present

## 2022-07-20 DIAGNOSIS — J40 Bronchitis, not specified as acute or chronic: Secondary | ICD-10-CM | POA: Diagnosis not present

## 2022-07-20 DIAGNOSIS — E782 Mixed hyperlipidemia: Secondary | ICD-10-CM | POA: Diagnosis not present

## 2022-08-22 DIAGNOSIS — J45901 Unspecified asthma with (acute) exacerbation: Secondary | ICD-10-CM | POA: Diagnosis not present

## 2022-08-22 DIAGNOSIS — J4540 Moderate persistent asthma, uncomplicated: Secondary | ICD-10-CM | POA: Diagnosis not present

## 2022-08-22 DIAGNOSIS — K219 Gastro-esophageal reflux disease without esophagitis: Secondary | ICD-10-CM | POA: Diagnosis not present

## 2022-08-22 DIAGNOSIS — J45990 Exercise induced bronchospasm: Secondary | ICD-10-CM | POA: Diagnosis not present

## 2022-08-22 DIAGNOSIS — J309 Allergic rhinitis, unspecified: Secondary | ICD-10-CM | POA: Diagnosis not present

## 2022-10-18 DIAGNOSIS — J069 Acute upper respiratory infection, unspecified: Secondary | ICD-10-CM | POA: Diagnosis not present

## 2022-10-18 DIAGNOSIS — J45901 Unspecified asthma with (acute) exacerbation: Secondary | ICD-10-CM | POA: Diagnosis not present

## 2022-10-18 DIAGNOSIS — Z1159 Encounter for screening for other viral diseases: Secondary | ICD-10-CM | POA: Diagnosis not present

## 2022-10-18 DIAGNOSIS — J309 Allergic rhinitis, unspecified: Secondary | ICD-10-CM | POA: Diagnosis not present

## 2022-11-02 DIAGNOSIS — Z7185 Encounter for immunization safety counseling: Secondary | ICD-10-CM | POA: Diagnosis not present

## 2022-11-02 DIAGNOSIS — Z23 Encounter for immunization: Secondary | ICD-10-CM | POA: Diagnosis not present

## 2022-11-02 DIAGNOSIS — J45909 Unspecified asthma, uncomplicated: Secondary | ICD-10-CM | POA: Diagnosis not present

## 2022-11-02 DIAGNOSIS — J45990 Exercise induced bronchospasm: Secondary | ICD-10-CM | POA: Diagnosis not present

## 2022-11-02 DIAGNOSIS — K219 Gastro-esophageal reflux disease without esophagitis: Secondary | ICD-10-CM | POA: Diagnosis not present

## 2022-11-02 DIAGNOSIS — J309 Allergic rhinitis, unspecified: Secondary | ICD-10-CM | POA: Diagnosis not present

## 2023-01-03 DIAGNOSIS — Z23 Encounter for immunization: Secondary | ICD-10-CM | POA: Diagnosis not present

## 2023-02-01 DIAGNOSIS — I491 Atrial premature depolarization: Secondary | ICD-10-CM | POA: Diagnosis not present

## 2023-02-01 DIAGNOSIS — K219 Gastro-esophageal reflux disease without esophagitis: Secondary | ICD-10-CM | POA: Diagnosis not present

## 2023-02-01 DIAGNOSIS — I10 Essential (primary) hypertension: Secondary | ICD-10-CM | POA: Diagnosis not present

## 2023-02-01 DIAGNOSIS — J309 Allergic rhinitis, unspecified: Secondary | ICD-10-CM | POA: Diagnosis not present

## 2023-02-01 DIAGNOSIS — R0609 Other forms of dyspnea: Secondary | ICD-10-CM | POA: Diagnosis not present

## 2023-02-01 DIAGNOSIS — J45909 Unspecified asthma, uncomplicated: Secondary | ICD-10-CM | POA: Diagnosis not present

## 2023-02-09 DIAGNOSIS — J45909 Unspecified asthma, uncomplicated: Secondary | ICD-10-CM | POA: Diagnosis not present

## 2023-02-19 NOTE — Progress Notes (Unsigned)
Cardiology Office Note:  .   Date:  02/21/2023  ID:  Albert Howell, DOB 22-Nov-1955, MRN [id number] PCP: Lewis Moccasin, MD  [facility] Providers Cardiologist:  Yates Decamp, MD   History of Present Illness: .   Albert Howell is a 67 y.o.African-American male patient with history of hypertension, hypercholesterolemia, vitamin D deficiency, who is a non-smoker, nondiabetic referred to me for chest pain and dyspnea on exertion. I had seen him 2 years ago for palpitations.  Discussed the use of AI scribe software for clinical note transcription with the patient, who gave verbal consent to proceed.  History of Present Illness   The patient, with a history of palpitations secondary to caffeine use, presents with a recent episode of extreme weakness and fatigue. He describes an incident approximately three to four weeks ago where he felt so weak he could hardly pull sheets up on the bed. He also experienced lightheadedness, but no sweating. The patient describes the sensation as feeling "out of sync" and as if his body was asleep. He also experienced a numbing sensation. The episode resolved on its own.  He has not had any recurrence of the symptoms.  The patient reports increased fatigue and shortness of breath, particularly when doing housework or other physical activities. He notes that this is a change from the previous year. He also reports waking up with headaches and feeling tired, suggesting he may not be sleeping well. The patient admits to being a heavy snorer, but has not been tested for sleep apnea.   Review of Systems  Cardiovascular:  Positive for dyspnea on exertion. Negative for chest pain, leg swelling and orthopnea.  Respiratory:  Positive for snoring.    Labs   External Labs:  Labs 05/29/2020:  Total cholesterol 209, triglycerides 222, HDL 34, LDL 135.  Physical Exam:   VS:  BP 118/78 (BP Location: Left Arm, Patient Position: Sitting, Cuff Size: Large)   Pulse  75   Resp 16   Ht 5\' 8"  (1.727 m)   Wt 209 lb (94.8 kg)   SpO2 98%   BMI 31.78 kg/m    Wt Readings from Last 3 Encounters:  02/21/23 209 lb (94.8 kg)  02/17/21 200 lb (90.7 kg)  02/03/21 200 lb (90.7 kg)     Physical Exam Constitutional:      Appearance: He is obese.  Neck:     Vascular: No carotid bruit or JVD.  Cardiovascular:     Rate and Rhythm: Normal rate and regular rhythm.     Pulses: Intact distal pulses.     Heart sounds: Normal heart sounds. No murmur heard.    No gallop.  Pulmonary:     Effort: Pulmonary effort is normal.     Breath sounds: Normal breath sounds.  Abdominal:     General: Bowel sounds are normal.     Palpations: Abdomen is soft.  Musculoskeletal:     Right lower leg: No edema.     Left lower leg: No edema.    Studies Reviewed: .    NA EKG:    EKG Interpretation Date/Time:  Tuesday February 21 2023 08:03:37 EST Ventricular Rate:  75 PR Interval:  176 QRS Duration:  70 QT Interval:  376 QTC Calculation: 419 R Axis:   25  Text Interpretation: EKG 02/21/2023: Normal sinus rhythm at rate of 75 bpm, normal axis, no evidence of ischemia, normal EKG. Single PAC.  No significant change compared to 11/10/2020. Reconfirmed by Delrae Rend (  [id number]) on 02/21/2023 8:41:07 AM    EKG 01/07/2021: Normal sinus rhythm at rate of 65 bpm, normal axis. No evidence of ischemia, normal EKG.   Medications and allergies    No Known Allergies   Current Outpatient Medications:    albuterol (VENTOLIN HFA) 108 (90 Base) MCG/ACT inhaler, Inhale 1-2 puffs into the lungs every 4 (four) hours as needed., Disp: , Rfl:    Cholecalciferol (VITAMIN D3) 5000 units CAPS, Take 5,000 Units by mouth daily., Disp: , Rfl:    diclofenac sodium (VOLTAREN) 1 % GEL, Apply topically 4 (four) times daily., Disp: , Rfl:    fluticasone (FLONASE) 50 MCG/ACT nasal spray, Place 1 spray into both nostrils as needed., Disp: , Rfl:    ibuprofen (ADVIL) 200 MG tablet, Take 200-400 mg  by mouth daily as needed for mild pain., Disp: , Rfl:    multivitamin (ONE-A-DAY MEN'S) TABS tablet, Take 1 tablet by mouth daily., Disp: , Rfl:    nitroGLYCERIN (NITROSTAT) 0.4 MG SL tablet, Place 0.4 mg under the tongue every 5 (five) minutes as needed., Disp: , Rfl:    simvastatin (ZOCOR) 5 MG tablet, Take 5 mg by mouth at bedtime., Disp: , Rfl:    TRELEGY ELLIPTA 200-62.5-25 MCG/ACT AEPB, Inhale 1 puff into the lungs daily., Disp: , Rfl:    valsartan (DIOVAN) 40 MG tablet, Take 40 mg by mouth at bedtime., Disp: , Rfl:    ASSESSMENT AND PLAN: .      ICD-10-CM   1. Near syncope  R55 EKG 12-Lead    ECHOCARDIOGRAM COMPLETE    EXERCISE TOLERANCE TEST (ETT)    Cardiac Stress Test: Informed Consent Details: Physician/Practitioner Attestation; Transcribe to consent form and obtain patient signature    2. Dyspnea on exertion  R06.09 ECHOCARDIOGRAM COMPLETE    EXERCISE TOLERANCE TEST (ETT)    Cardiac Stress Test: Informed Consent Details: Physician/Practitioner Attestation; Transcribe to consent form and obtain patient signature    3. Precordial pain  R07.2 ECHOCARDIOGRAM COMPLETE    EXERCISE TOLERANCE TEST (ETT)    4. Primary hypertension  I10 ECHOCARDIOGRAM COMPLETE    EXERCISE TOLERANCE TEST (ETT)     Assessment and Plan    Vasovagal Syncope Recent episode of weakness and lightheadedness, described as feeling out of sync and body feeling asleep. No recurrence since the episode 3-4 weeks ago. -Educated on the nature of vasovagal syncope and advised to sit or lie down if similar symptoms occur in the future.  Dyspnea on Exertion Increased fatigue and shortness of breath compared to previous year, particularly noticeable during physical activities. No chest pain reported. Possible contributing factors include aging, recent exposure to mold, and weight. -Order treadmill stress test to rule out significant heart disease or irregular heart rhythms. -Order echocardiogram to assess heart  function. -Continue Valsartan 40mg  for blood pressure control. -Encourage weight loss. -He has had recent exposure to mold as well and this could explain his dyspnea as well.  Hyperlipidemia Currently on Simvastatin for cholesterol management. -Continue Simvastatin as prescribed.  Suspected Sleep Apnea Reports heavy snoring and occasional morning headaches. No formal diagnosis or testing done. -Recommend sleep study via primary care provider, Dr. Maryelizabeth Rowan.  Follow-up Unless there is a problem with the tests, no immediate follow-up appointment needed. Patient advised to contact the office if needed.         Informed Consent   Shared Decision Making/Informed Consent The risks [chest pain, shortness of breath, cardiac arrhythmias, dizziness, blood pressure fluctuations, myocardial infarction, stroke/transient ischemic  attack, and life-threatening complications (estimated to be 1 in 10,000)], benefits (risk stratification, diagnosing coronary artery disease, treatment guidance) and alternatives of an exercise tolerance test were discussed in detail with Mr. Kronick and he agrees to proceed.      Signed,  Yates Decamp, MD, [facility] 02/21/2023, 10:56 AM [facility] [address] [place], [place] [address] Phone: [phone]. Fax:  [phone]

## 2023-02-21 ENCOUNTER — Encounter: Payer: Self-pay | Admitting: Cardiology

## 2023-02-21 ENCOUNTER — Ambulatory Visit: Payer: PPO | Attending: Cardiology | Admitting: Cardiology

## 2023-02-21 VITALS — BP 118/78 | HR 75 | Resp 16 | Ht 68.0 in | Wt 209.0 lb

## 2023-02-21 DIAGNOSIS — R0609 Other forms of dyspnea: Secondary | ICD-10-CM | POA: Diagnosis not present

## 2023-02-21 DIAGNOSIS — R55 Syncope and collapse: Secondary | ICD-10-CM

## 2023-02-21 DIAGNOSIS — R072 Precordial pain: Secondary | ICD-10-CM | POA: Diagnosis not present

## 2023-02-21 DIAGNOSIS — I10 Essential (primary) hypertension: Secondary | ICD-10-CM

## 2023-02-21 NOTE — Patient Instructions (Signed)
Medication Instructions:  Your physician recommends that you continue on your current medications as directed. Please refer to the Current Medication list given to you today.  *If you need a refill on your cardiac medications before your next appointment, please call your pharmacy*   Lab Work: none If you have labs (blood work) drawn today and your tests are completely normal, you will receive your results only by: MyChart Message (if you have MyChart) OR A paper copy in the mail If you have any lab test that is abnormal or we need to change your treatment, we will call you to review the results.   Testing/Procedures: Your physician has requested that you have an echocardiogram. Echocardiography is a painless test that uses sound waves to create images of your heart. It provides your doctor with information about the size and shape of your heart and how well your heart's chambers and valves are working. This procedure takes approximately one hour. There are no restrictions for this procedure. Please do NOT wear cologne, perfume, aftershave, or lotions (deodorant is allowed). Please arrive 15 minutes prior to your appointment time.  Please note: We ask at that you not bring children with you during ultrasound (echo/ vascular) testing. Due to room size and safety concerns, children are not allowed in the ultrasound rooms during exams. Our front office staff cannot provide observation of children in our lobby area while testing is being conducted. An adult accompanying a patient to their appointment will only be allowed in the ultrasound room at the discretion of the ultrasound technician under special circumstances. We apologize for any inconvenience.  Your physician has requested that you have an exercise tolerance test. For further information please visit [email or web address] Please also follow instruction sheet, as given.    Follow-Up: At [facility], you and your health needs  are our priority.  As part of our continuing mission to provide you with exceptional heart care, we have created designated Provider Care Teams.  These Care Teams include your primary Cardiologist (physician) and Advanced Practice Providers (APPs -  Physician Assistants and Nurse Practitioners) who all work together to provide you with the care you need, when you need it.  We recommend signing up for the patient portal called "MyChart".  Sign up information is provided on this After Visit Summary.  MyChart is used to connect with patients for Virtual Visits (Telemedicine).  Patients are able to view lab/test results, encounter notes, upcoming appointments, etc.  Non-urgent messages can be sent to your provider as well.   To learn more about what you can do with MyChart, go to [email or web address].    Your next appointment:   As needed  Provider:   Yates Decamp, MD     Other Instructions

## 2023-02-23 DIAGNOSIS — R400 Somnolence: Secondary | ICD-10-CM | POA: Diagnosis not present

## 2023-02-23 DIAGNOSIS — G4733 Obstructive sleep apnea (adult) (pediatric): Secondary | ICD-10-CM | POA: Diagnosis not present

## 2023-02-23 DIAGNOSIS — K219 Gastro-esophageal reflux disease without esophagitis: Secondary | ICD-10-CM | POA: Diagnosis not present

## 2023-02-23 DIAGNOSIS — I10 Essential (primary) hypertension: Secondary | ICD-10-CM | POA: Diagnosis not present

## 2023-02-23 DIAGNOSIS — R0683 Snoring: Secondary | ICD-10-CM | POA: Diagnosis not present

## 2023-02-23 DIAGNOSIS — R079 Chest pain, unspecified: Secondary | ICD-10-CM | POA: Diagnosis not present

## 2023-02-23 DIAGNOSIS — I491 Atrial premature depolarization: Secondary | ICD-10-CM | POA: Diagnosis not present

## 2023-02-23 DIAGNOSIS — J309 Allergic rhinitis, unspecified: Secondary | ICD-10-CM | POA: Diagnosis not present

## 2023-02-24 DIAGNOSIS — Z1331 Encounter for screening for depression: Secondary | ICD-10-CM | POA: Diagnosis not present

## 2023-02-24 DIAGNOSIS — H9190 Unspecified hearing loss, unspecified ear: Secondary | ICD-10-CM | POA: Diagnosis not present

## 2023-02-24 DIAGNOSIS — Z1339 Encounter for screening examination for other mental health and behavioral disorders: Secondary | ICD-10-CM | POA: Diagnosis not present

## 2023-02-24 DIAGNOSIS — Z0000 Encounter for general adult medical examination without abnormal findings: Secondary | ICD-10-CM | POA: Diagnosis not present

## 2023-03-02 DIAGNOSIS — G4730 Sleep apnea, unspecified: Secondary | ICD-10-CM | POA: Diagnosis not present

## 2023-03-07 DIAGNOSIS — Z7185 Encounter for immunization safety counseling: Secondary | ICD-10-CM | POA: Diagnosis not present

## 2023-03-07 DIAGNOSIS — K635 Polyp of colon: Secondary | ICD-10-CM | POA: Diagnosis not present

## 2023-03-07 DIAGNOSIS — M542 Cervicalgia: Secondary | ICD-10-CM | POA: Diagnosis not present

## 2023-03-07 DIAGNOSIS — M545 Low back pain, unspecified: Secondary | ICD-10-CM | POA: Diagnosis not present

## 2023-03-07 DIAGNOSIS — M25812 Other specified joint disorders, left shoulder: Secondary | ICD-10-CM | POA: Diagnosis not present

## 2023-03-13 DIAGNOSIS — Z1211 Encounter for screening for malignant neoplasm of colon: Secondary | ICD-10-CM | POA: Diagnosis not present

## 2023-03-21 ENCOUNTER — Encounter: Payer: Self-pay | Admitting: Cardiology

## 2023-03-21 ENCOUNTER — Ambulatory Visit (INDEPENDENT_AMBULATORY_CARE_PROVIDER_SITE_OTHER): Payer: PPO

## 2023-03-21 ENCOUNTER — Ambulatory Visit: Payer: PPO | Attending: Cardiology

## 2023-03-21 DIAGNOSIS — R072 Precordial pain: Secondary | ICD-10-CM

## 2023-03-21 DIAGNOSIS — R0609 Other forms of dyspnea: Secondary | ICD-10-CM

## 2023-03-21 DIAGNOSIS — I10 Essential (primary) hypertension: Secondary | ICD-10-CM | POA: Insufficient documentation

## 2023-03-21 DIAGNOSIS — R55 Syncope and collapse: Secondary | ICD-10-CM

## 2023-03-21 LAB — EXERCISE TOLERANCE TEST
Angina Index: 0
Duke Treadmill Score: 5
Estimated workload: 6.6
Exercise duration (min): 4 min
Exercise duration (sec): 42 s
MPHR: 151 {beats}/min
Peak HR: 141 {beats}/min
Percent HR: 93 %
RPE: 16
Rest HR: 71 {beats}/min
ST Depression (mm): 0 mm

## 2023-03-21 LAB — ECHOCARDIOGRAM COMPLETE
Area-P 1/2: 3.42 cm2
S' Lateral: 2.4 cm

## 2023-04-10 DIAGNOSIS — G4733 Obstructive sleep apnea (adult) (pediatric): Secondary | ICD-10-CM | POA: Diagnosis not present

## 2023-04-17 DIAGNOSIS — H04123 Dry eye syndrome of bilateral lacrimal glands: Secondary | ICD-10-CM | POA: Diagnosis not present

## 2023-04-17 DIAGNOSIS — H25813 Combined forms of age-related cataract, bilateral: Secondary | ICD-10-CM | POA: Diagnosis not present

## 2023-04-17 DIAGNOSIS — D492 Neoplasm of unspecified behavior of bone, soft tissue, and skin: Secondary | ICD-10-CM | POA: Diagnosis not present

## 2023-04-17 DIAGNOSIS — H524 Presbyopia: Secondary | ICD-10-CM | POA: Diagnosis not present

## 2023-04-17 DIAGNOSIS — H35362 Drusen (degenerative) of macula, left eye: Secondary | ICD-10-CM | POA: Diagnosis not present

## 2023-04-24 DIAGNOSIS — G4733 Obstructive sleep apnea (adult) (pediatric): Secondary | ICD-10-CM | POA: Diagnosis not present

## 2023-04-28 DIAGNOSIS — E785 Hyperlipidemia, unspecified: Secondary | ICD-10-CM | POA: Diagnosis not present

## 2023-04-28 DIAGNOSIS — R5383 Other fatigue: Secondary | ICD-10-CM | POA: Diagnosis not present

## 2023-04-28 DIAGNOSIS — E559 Vitamin D deficiency, unspecified: Secondary | ICD-10-CM | POA: Diagnosis not present

## 2023-04-28 DIAGNOSIS — I10 Essential (primary) hypertension: Secondary | ICD-10-CM | POA: Diagnosis not present

## 2023-05-02 DIAGNOSIS — Z789 Other specified health status: Secondary | ICD-10-CM | POA: Diagnosis not present

## 2023-05-02 DIAGNOSIS — J45901 Unspecified asthma with (acute) exacerbation: Secondary | ICD-10-CM | POA: Diagnosis not present

## 2023-05-02 DIAGNOSIS — E559 Vitamin D deficiency, unspecified: Secondary | ICD-10-CM | POA: Diagnosis not present

## 2023-05-02 DIAGNOSIS — I10 Essential (primary) hypertension: Secondary | ICD-10-CM | POA: Diagnosis not present

## 2023-05-02 DIAGNOSIS — G4733 Obstructive sleep apnea (adult) (pediatric): Secondary | ICD-10-CM | POA: Diagnosis not present

## 2023-05-02 DIAGNOSIS — E782 Mixed hyperlipidemia: Secondary | ICD-10-CM | POA: Diagnosis not present

## 2023-05-11 DIAGNOSIS — D2310 Other benign neoplasm of skin of unspecified eyelid, including canthus: Secondary | ICD-10-CM | POA: Diagnosis not present

## 2023-05-11 DIAGNOSIS — D492 Neoplasm of unspecified behavior of bone, soft tissue, and skin: Secondary | ICD-10-CM | POA: Diagnosis not present

## 2023-05-18 DIAGNOSIS — H25813 Combined forms of age-related cataract, bilateral: Secondary | ICD-10-CM | POA: Diagnosis not present

## 2023-05-18 DIAGNOSIS — H43812 Vitreous degeneration, left eye: Secondary | ICD-10-CM | POA: Diagnosis not present

## 2023-05-18 DIAGNOSIS — D492 Neoplasm of unspecified behavior of bone, soft tissue, and skin: Secondary | ICD-10-CM | POA: Diagnosis not present

## 2023-05-18 DIAGNOSIS — H35362 Drusen (degenerative) of macula, left eye: Secondary | ICD-10-CM | POA: Diagnosis not present

## 2023-08-15 DIAGNOSIS — R351 Nocturia: Secondary | ICD-10-CM | POA: Diagnosis not present

## 2023-08-15 DIAGNOSIS — E785 Hyperlipidemia, unspecified: Secondary | ICD-10-CM | POA: Diagnosis not present

## 2023-08-15 DIAGNOSIS — Z125 Encounter for screening for malignant neoplasm of prostate: Secondary | ICD-10-CM | POA: Diagnosis not present

## 2023-08-15 DIAGNOSIS — R5383 Other fatigue: Secondary | ICD-10-CM | POA: Diagnosis not present

## 2023-08-21 DIAGNOSIS — Z0000 Encounter for general adult medical examination without abnormal findings: Secondary | ICD-10-CM | POA: Diagnosis not present

## 2023-08-21 DIAGNOSIS — R350 Frequency of micturition: Secondary | ICD-10-CM | POA: Diagnosis not present

## 2023-09-14 DIAGNOSIS — H6090 Unspecified otitis externa, unspecified ear: Secondary | ICD-10-CM | POA: Diagnosis not present

## 2023-09-14 DIAGNOSIS — H6121 Impacted cerumen, right ear: Secondary | ICD-10-CM | POA: Diagnosis not present

## 2023-09-14 DIAGNOSIS — I10 Essential (primary) hypertension: Secondary | ICD-10-CM | POA: Diagnosis not present

## 2023-11-20 DIAGNOSIS — R5383 Other fatigue: Secondary | ICD-10-CM | POA: Diagnosis not present

## 2023-11-20 DIAGNOSIS — E559 Vitamin D deficiency, unspecified: Secondary | ICD-10-CM | POA: Diagnosis not present

## 2023-11-22 DIAGNOSIS — F411 Generalized anxiety disorder: Secondary | ICD-10-CM | POA: Diagnosis not present

## 2023-11-22 DIAGNOSIS — F331 Major depressive disorder, recurrent, moderate: Secondary | ICD-10-CM | POA: Diagnosis not present

## 2023-11-22 DIAGNOSIS — G4700 Insomnia, unspecified: Secondary | ICD-10-CM | POA: Diagnosis not present

## 2023-11-22 DIAGNOSIS — E559 Vitamin D deficiency, unspecified: Secondary | ICD-10-CM | POA: Diagnosis not present

## 2023-11-22 DIAGNOSIS — Z23 Encounter for immunization: Secondary | ICD-10-CM | POA: Diagnosis not present

## 2023-11-22 DIAGNOSIS — J45909 Unspecified asthma, uncomplicated: Secondary | ICD-10-CM | POA: Diagnosis not present

## 2023-11-22 DIAGNOSIS — I10 Essential (primary) hypertension: Secondary | ICD-10-CM | POA: Diagnosis not present
